# Patient Record
Sex: Male | Born: 1975 | Race: Black or African American | Hispanic: No | Marital: Single | Smoking: Never smoker
Health system: Southern US, Community
[De-identification: ages and names within clinical notes are randomized; demographics above are authoritative.]

## PROBLEM LIST (undated history)

## (undated) DIAGNOSIS — G4730 Sleep apnea, unspecified: Secondary | ICD-10-CM

## (undated) DIAGNOSIS — T7840XA Allergy, unspecified, initial encounter: Secondary | ICD-10-CM

## (undated) DIAGNOSIS — E669 Obesity, unspecified: Secondary | ICD-10-CM

## (undated) DIAGNOSIS — M1990 Unspecified osteoarthritis, unspecified site: Secondary | ICD-10-CM

## (undated) DIAGNOSIS — K219 Gastro-esophageal reflux disease without esophagitis: Secondary | ICD-10-CM

## (undated) HISTORY — PX: MANDIBLE SURGERY: SHX707

## (undated) HISTORY — DX: Obesity, unspecified: E66.9

## (undated) HISTORY — DX: Sleep apnea, unspecified: G47.30

## (undated) HISTORY — DX: Allergy, unspecified, initial encounter: T78.40XA

---

## 1999-03-19 ENCOUNTER — Emergency Department (HOSPITAL_COMMUNITY): Admission: EM | Admit: 1999-03-19 | Discharge: 1999-03-20 | Payer: Self-pay | Admitting: Emergency Medicine

## 1999-07-12 ENCOUNTER — Encounter: Payer: Self-pay | Admitting: Emergency Medicine

## 1999-07-12 ENCOUNTER — Emergency Department (HOSPITAL_COMMUNITY): Admission: EM | Admit: 1999-07-12 | Discharge: 1999-07-12 | Payer: Self-pay | Admitting: Emergency Medicine

## 1999-08-21 ENCOUNTER — Encounter: Payer: Self-pay | Admitting: Emergency Medicine

## 1999-08-21 ENCOUNTER — Emergency Department (HOSPITAL_COMMUNITY): Admission: EM | Admit: 1999-08-21 | Discharge: 1999-08-21 | Payer: Self-pay | Admitting: Emergency Medicine

## 2002-09-08 ENCOUNTER — Encounter: Payer: Self-pay | Admitting: Family Medicine

## 2002-09-08 ENCOUNTER — Encounter: Admission: RE | Admit: 2002-09-08 | Discharge: 2002-09-08 | Payer: Self-pay | Admitting: Family Medicine

## 2002-10-19 ENCOUNTER — Encounter: Admission: RE | Admit: 2002-10-19 | Discharge: 2003-01-17 | Payer: Self-pay | Admitting: Family Medicine

## 2004-01-19 ENCOUNTER — Ambulatory Visit (HOSPITAL_BASED_OUTPATIENT_CLINIC_OR_DEPARTMENT_OTHER): Admission: RE | Admit: 2004-01-19 | Discharge: 2004-01-19 | Payer: Self-pay | Admitting: Pulmonary Disease

## 2005-04-19 ENCOUNTER — Ambulatory Visit: Payer: Self-pay | Admitting: Family Medicine

## 2005-04-26 ENCOUNTER — Ambulatory Visit: Payer: Self-pay | Admitting: Family Medicine

## 2005-06-01 ENCOUNTER — Encounter: Admission: RE | Admit: 2005-06-01 | Discharge: 2005-06-01 | Payer: Self-pay | Admitting: Sports Medicine

## 2005-06-10 ENCOUNTER — Encounter: Admission: RE | Admit: 2005-06-10 | Discharge: 2005-09-08 | Payer: Self-pay | Admitting: Family Medicine

## 2005-06-25 ENCOUNTER — Ambulatory Visit (HOSPITAL_COMMUNITY): Admission: RE | Admit: 2005-06-25 | Discharge: 2005-06-26 | Payer: Self-pay | Admitting: Orthopedic Surgery

## 2006-12-29 ENCOUNTER — Ambulatory Visit: Payer: Self-pay | Admitting: Family Medicine

## 2007-01-08 ENCOUNTER — Ambulatory Visit: Payer: Self-pay | Admitting: Family Medicine

## 2007-01-26 ENCOUNTER — Ambulatory Visit: Payer: Self-pay | Admitting: Family Medicine

## 2007-01-26 LAB — CONVERTED CEMR LAB
ALT: 28 units/L (ref 0–40)
Albumin: 3.4 g/dL — ABNORMAL LOW (ref 3.5–5.2)
Alkaline Phosphatase: 71 units/L (ref 39–117)
BUN: 9 mg/dL (ref 6–23)
Basophils Absolute: 0.1 10*3/uL (ref 0.0–0.1)
Basophils Relative: 0.2 % (ref 0.0–1.0)
CO2: 31 meq/L (ref 19–32)
Calcium: 8.9 mg/dL (ref 8.4–10.5)
GFR calc Af Amer: 113 mL/min
HDL: 49.8 mg/dL (ref >39.0)
LDL Cholesterol: 96 mg/dL (ref 0–99)
Lymphocytes Relative: 25.4 % (ref 12.0–46.0)
Monocytes Relative: 7.6 % (ref 3.0–11.0)
Neutro Abs: 5 10*3/uL (ref 1.4–7.7)
Platelets: 255 10*3/uL (ref 150–400)
RDW: 12.3 % (ref 11.5–14.6)
T3, Free: 4.1 pg/mL (ref 2.3–4.2)
TSH: 2.78 microintl units/mL (ref 0.35–5.50)
Total Protein: 6.5 g/dL (ref 6.0–8.3)
Triglycerides: 79 mg/dL (ref 0–149)
VLDL: 16 mg/dL (ref 0–40)

## 2007-08-25 DIAGNOSIS — J309 Allergic rhinitis, unspecified: Secondary | ICD-10-CM | POA: Insufficient documentation

## 2007-12-25 DIAGNOSIS — R071 Chest pain on breathing: Secondary | ICD-10-CM

## 2007-12-28 ENCOUNTER — Ambulatory Visit: Payer: Self-pay | Admitting: Family Medicine

## 2007-12-30 ENCOUNTER — Encounter: Payer: Self-pay | Admitting: Family Medicine

## 2007-12-31 ENCOUNTER — Ambulatory Visit: Payer: Self-pay | Admitting: Family Medicine

## 2008-08-18 ENCOUNTER — Telehealth: Payer: Self-pay | Admitting: *Deleted

## 2008-08-23 ENCOUNTER — Encounter: Payer: Self-pay | Admitting: Family Medicine

## 2008-11-19 DIAGNOSIS — R1011 Right upper quadrant pain: Secondary | ICD-10-CM

## 2008-11-22 ENCOUNTER — Ambulatory Visit: Payer: Self-pay | Admitting: Family Medicine

## 2008-11-24 ENCOUNTER — Ambulatory Visit (HOSPITAL_COMMUNITY): Admission: RE | Admit: 2008-11-24 | Discharge: 2008-11-24 | Payer: Self-pay | Admitting: Family Medicine

## 2008-11-25 ENCOUNTER — Ambulatory Visit: Payer: Self-pay | Admitting: Family Medicine

## 2008-11-25 LAB — CONVERTED CEMR LAB
ALT: 26 units/L (ref 0–53)
AST: 21 units/L (ref 0–37)
Albumin: 3.5 g/dL (ref 3.5–5.2)
Alkaline Phosphatase: 88 units/L (ref 39–117)
Amylase: 45 units/L (ref 27–131)
Basophils Relative: 0.8 % (ref 0.0–3.0)
Eosinophils Relative: 1.5 % (ref 0.0–5.0)
Hemoglobin: 15.7 g/dL (ref 13.0–17.0)
Lymphocytes Relative: 29.6 % (ref 12.0–46.0)
MCHC: 35.2 g/dL (ref 30.0–36.0)
Monocytes Relative: 6.7 % (ref 3.0–12.0)
Neutro Abs: 5.7 10*3/uL (ref 1.4–7.7)
Neutrophils Relative %: 61.4 % (ref 43.0–77.0)
RBC: 4.91 M/uL (ref 4.22–5.81)
Total Protein: 7.2 g/dL (ref 6.0–8.3)
WBC: 9.2 10*3/uL (ref 4.5–10.5)

## 2008-11-29 ENCOUNTER — Encounter: Payer: Self-pay | Admitting: Family Medicine

## 2008-11-29 ENCOUNTER — Telehealth: Payer: Self-pay | Admitting: *Deleted

## 2008-12-01 ENCOUNTER — Ambulatory Visit (HOSPITAL_COMMUNITY): Admission: RE | Admit: 2008-12-01 | Discharge: 2008-12-01 | Payer: Self-pay | Admitting: Family Medicine

## 2008-12-06 ENCOUNTER — Encounter (INDEPENDENT_AMBULATORY_CARE_PROVIDER_SITE_OTHER): Payer: Self-pay | Admitting: *Deleted

## 2008-12-06 ENCOUNTER — Ambulatory Visit: Payer: Self-pay | Admitting: Gastroenterology

## 2008-12-13 ENCOUNTER — Telehealth: Payer: Self-pay | Admitting: *Deleted

## 2008-12-13 ENCOUNTER — Ambulatory Visit: Payer: Self-pay | Admitting: Gastroenterology

## 2008-12-13 DIAGNOSIS — R079 Chest pain, unspecified: Secondary | ICD-10-CM | POA: Insufficient documentation

## 2008-12-16 ENCOUNTER — Telehealth: Payer: Self-pay | Admitting: *Deleted

## 2008-12-20 ENCOUNTER — Ambulatory Visit: Payer: Self-pay | Admitting: Family Medicine

## 2009-01-17 ENCOUNTER — Ambulatory Visit: Payer: Self-pay | Admitting: Gastroenterology

## 2009-05-01 ENCOUNTER — Telehealth: Payer: Self-pay | Admitting: Gastroenterology

## 2009-05-02 ENCOUNTER — Encounter: Payer: Self-pay | Admitting: Gastroenterology

## 2009-05-02 ENCOUNTER — Ambulatory Visit: Payer: Self-pay | Admitting: Gastroenterology

## 2009-05-02 DIAGNOSIS — R1013 Epigastric pain: Secondary | ICD-10-CM | POA: Insufficient documentation

## 2009-05-02 DIAGNOSIS — K5900 Constipation, unspecified: Secondary | ICD-10-CM | POA: Insufficient documentation

## 2009-05-02 LAB — CONVERTED CEMR LAB
AST: 18 units/L (ref 0–37)
Albumin: 3.6 g/dL (ref 3.5–5.2)
BUN: 8 mg/dL (ref 6–23)
Basophils Relative: 0.4 % (ref 0.0–3.0)
Calcium: 9 mg/dL (ref 8.4–10.5)
Eosinophils Absolute: 0.1 10*3/uL (ref 0.0–0.7)
Ferritin: 66.2 ng/mL (ref 22.0–322.0)
Folate: >20 ng/mL
GFR calc non Af Amer: 125.21 mL/min (ref >60)
Glucose, Bld: 98 mg/dL (ref 70–99)
HCT: 44.7 % (ref 39.0–52.0)
Iron: 83 ug/dL (ref 42–165)
Lipase: 16 units/L (ref 11.0–59.0)
Lymphs Abs: 1.8 10*3/uL (ref 0.7–4.0)
MCHC: 34.6 g/dL (ref 30.0–36.0)
MCV: 90.5 fL (ref 78.0–100.0)
Monocytes Absolute: 0.4 10*3/uL (ref 0.1–1.0)
Neutrophils Relative %: 74.9 % (ref 43.0–77.0)
Platelets: 219 10*3/uL (ref 150.0–400.0)
Potassium: 4.7 meq/L (ref 3.5–5.1)
Sodium: 140 meq/L (ref 135–145)
TSH: 2.08 microintl units/mL (ref 0.35–5.50)
Total Bilirubin: 0.8 mg/dL (ref 0.3–1.2)

## 2009-05-04 ENCOUNTER — Ambulatory Visit: Payer: Self-pay | Admitting: Gastroenterology

## 2009-05-04 ENCOUNTER — Ambulatory Visit (HOSPITAL_COMMUNITY): Admission: RE | Admit: 2009-05-04 | Discharge: 2009-05-04 | Payer: Self-pay | Admitting: Gastroenterology

## 2009-08-18 HISTORY — PX: KNEE ARTHROSCOPY: SHX127

## 2009-09-01 ENCOUNTER — Ambulatory Visit (HOSPITAL_COMMUNITY): Admission: RE | Admit: 2009-09-01 | Discharge: 2009-09-01 | Payer: Self-pay | Admitting: Orthopaedic Surgery

## 2010-01-24 ENCOUNTER — Ambulatory Visit: Payer: Self-pay | Admitting: Family Medicine

## 2010-01-24 DIAGNOSIS — J209 Acute bronchitis, unspecified: Secondary | ICD-10-CM

## 2010-01-29 ENCOUNTER — Telehealth: Payer: Self-pay | Admitting: Family Medicine

## 2010-02-20 ENCOUNTER — Ambulatory Visit: Payer: Self-pay | Admitting: Family Medicine

## 2010-02-20 DIAGNOSIS — F329 Major depressive disorder, single episode, unspecified: Secondary | ICD-10-CM

## 2010-02-20 LAB — CONVERTED CEMR LAB
AST: 16 units/L (ref 0–37)
Albumin: 3.5 g/dL (ref 3.5–5.2)
BUN: 12 mg/dL (ref 6–23)
Basophils Absolute: 0 10*3/uL (ref 0.0–0.1)
CO2: 28 meq/L (ref 19–32)
Chloride: 104 meq/L (ref 96–112)
Eosinophils Absolute: 0.3 10*3/uL (ref 0.0–0.7)
Glucose, Bld: 86 mg/dL (ref 70–99)
HCT: 41.4 % (ref 39.0–52.0)
Hemoglobin: 14.2 g/dL (ref 13.0–17.0)
Lymphs Abs: 2.4 10*3/uL (ref 0.7–4.0)
MCHC: 34.3 g/dL (ref 30.0–36.0)
MCV: 91.2 fL (ref 78.0–100.0)
Monocytes Absolute: 0.6 10*3/uL (ref 0.1–1.0)
Monocytes Relative: 6.2 % (ref 3.0–12.0)
Neutro Abs: 6.6 10*3/uL (ref 1.4–7.7)
Platelets: 251 10*3/uL (ref 150.0–400.0)
Potassium: 4 meq/L (ref 3.5–5.1)
RDW: 13.4 % (ref 11.5–14.6)
Sodium: 139 meq/L (ref 135–145)
TSH: 3.27 microintl units/mL (ref 0.35–5.50)
Total Bilirubin: 0.2 mg/dL — ABNORMAL LOW (ref 0.3–1.2)

## 2010-06-26 ENCOUNTER — Ambulatory Visit: Payer: Self-pay | Admitting: Family Medicine

## 2010-06-26 LAB — CONVERTED CEMR LAB
Calcium: 8.7 mg/dL (ref 8.4–10.5)
Chloride: 106 meq/L (ref 96–112)
Creatinine, Ser: 0.9 mg/dL (ref 0.4–1.5)
Sodium: 141 meq/L (ref 135–145)

## 2010-06-27 ENCOUNTER — Encounter: Payer: Self-pay | Admitting: Family Medicine

## 2010-07-30 ENCOUNTER — Ambulatory Visit: Payer: Self-pay | Admitting: Family Medicine

## 2010-09-17 ENCOUNTER — Emergency Department (HOSPITAL_COMMUNITY): Admission: EM | Admit: 2010-09-17 | Discharge: 2010-09-17 | Payer: Self-pay | Admitting: Emergency Medicine

## 2010-09-20 ENCOUNTER — Ambulatory Visit: Payer: Self-pay | Admitting: Family Medicine

## 2010-09-20 DIAGNOSIS — M5137 Other intervertebral disc degeneration, lumbosacral region: Secondary | ICD-10-CM | POA: Insufficient documentation

## 2010-09-24 ENCOUNTER — Telehealth: Payer: Self-pay | Admitting: Family Medicine

## 2010-10-02 ENCOUNTER — Encounter
Admission: RE | Admit: 2010-10-02 | Discharge: 2010-10-31 | Payer: Self-pay | Source: Home / Self Care | Attending: Family Medicine | Admitting: Family Medicine

## 2010-10-05 ENCOUNTER — Encounter: Payer: Self-pay | Admitting: Family Medicine

## 2010-12-18 NOTE — Letter (Signed)
Summary: Out of Work  Adult nurse at [employer]  [address]   [place], [place] [address]   Phone: [phone]  Fax: [phone]    June 27, 2010   Employee:  Handy Donnella Bi    To Whom It May Concern:   For Medical reasons, please excuse the above named employee from work for the following dates:  Start:   06/26/10  End:   06/28/10  If you need additional information, please feel free to contact our office.         Sincerely,    Governor Specking, MD

## 2010-12-18 NOTE — Assessment & Plan Note (Signed)
Summary: follow up on bp/cjr   Vital Signs:  Patient profile:   35 year old male Weight:      487 pounds Temp:     98.2 degrees F oral BP sitting:   140 / 82  (right arm) Cuff size:   large  Vitals Entered By: Kathrynn Speed CMA (July 30, 2010 12:07 PM) CC: Fup on BP, src Is Patient Diabetic? No   Primary Care Provider:  Kelle Darting, MD  CC:  Fup on BP and src.  History of Present Illness: Christopher Krause is a 35 year old male, who comes in today for follow-up of hypertension.  He is on lisinopril 20 -- 25 daily BP by me today 130/80.  Arm sitting position  Preventive Screening-Counseling & Management  Alcohol-Tobacco     Smoking Status: never  Current Medications (verified): 1)  Lisinopril-Hydrochlorothiazide 20-25 Mg Tabs (Lisinopril-Hydrochlorothiazide) .... Take One Tab By Mouth Once Daily  Allergies (verified): No Known Drug Allergies  Review of Systems      See HPI  Physical Exam  General:  Well-developed,well-nourished,in no acute distress; alert,appropriate and cooperative throughout examination Heart:  130/80   Impression & Recommendations:  Problem # 1:  HYPERTENSION, ESSENTIAL NOS (ICD-401.9) Assessment Improved  His updated medication list for this problem includes:    Lisinopril-hydrochlorothiazide 20-25 Mg Tabs (Lisinopril-hydrochlorothiazide) .Marland Kitchen... Take one tab by mouth once daily  Complete Medication List: 1)  Lisinopril-hydrochlorothiazide 20-25 Mg Tabs (Lisinopril-hydrochlorothiazide) .... Take one tab by mouth once daily  Patient Instructions: 1)  continue current medication. 2)  Set up a time in the next 4 to 6 weeks for a complete physical exam 3)  BMP prior to visit, ICD-9:........v70.0.Marland KitchenMarland KitchenMarland Kitchen401.01 4)  Hepatic Panel prior to visit, ICD-9: 5)  Lipid Panel prior to visit, ICD-9: 6)  TSH prior to visit, ICD-9: 7)  CBC w/ Diff prior to visit, ICD-9:

## 2010-12-18 NOTE — Progress Notes (Signed)
Summary: note for work  Phone Note Call from Patient   Caller: Patient Call For: Abran Cantor Summary of Call: still congested, wants to stay out of work today and go back tomorow. Asking for note for today- you gave him one for last week.  ph- [phone]  Called again. Rudy Jew, RN  January 29, 2010 2:03 PM  Initial call taken by: Raechel Ache, RN,  January 29, 2010 11:40 AM  Follow-up for Phone Call        okay Follow-up by: Nelwyn Salisbury MD,  January 29, 2010 3:22 PM  Additional Follow-up for Phone Call Additional follow up Details #1::        called for pick-up. Additional Follow-up by: Raechel Ache, RN,  January 29, 2010 3:25 PM

## 2010-12-18 NOTE — Letter (Signed)
Summary: Out of Work  Adult nurse at [facility] [employer]  [address]   [place], [place] [address]   Phone: [phone]  Fax: [phone]    June 27, 2010   Employee:  Aydeen Donnella Bi    To Whom It May Concern:   For Medical reasons, please excuse the above named employee from work for the following dates:  Start:    End:    If you need additional information, please feel free to contact our office.         Sincerely,    Kathrynn Speed CMA

## 2010-12-18 NOTE — Assessment & Plan Note (Signed)
Summary: BLOOD PRESSURE CHECK/MIGRAINES/CJR   Vital Signs:  Patient profile:   35 year old male Temp:     98.5 degrees F oral BP sitting:   140 / 98  (left arm) Cuff size:   large  Vitals Entered By: Kern Reap CMA Duncan Dull) (June 26, 2010 1:48 PM) CC: blood pressure, migraine headaches   Primary Care Provider:  Kelle Darting, MD  CC:  blood pressure and migraine headaches.  History of Present Illness: Christopher Krause is a 35 year old single male, who works at [employer], who comes in today for evaluation of hypertension.  About 6 weeks ago.  He saw the plant nurse, who diagnosed him to have hypertension and start him on lisinopril 20 -- 25 daily.  He did not monitor his blood pressure, nor did he go back for follow-up.  He stopped his medication 10 days ago because he ran out and 3 days ago began having headaches yesterday.  He went to urgent care.  His blood pressure was 174/90.  They advised him to restart his medication, which he has not done yet.  His weight is unmeasurable.  These over 450 pounds.  Positive family history of hypertension  Allergies (verified): No Known Drug Allergies  Past History:  Past medical, surgical, family and social histories (including risk factors) reviewed for relevance to current acute and chronic problems.  Past Medical History: Reviewed history from 12/06/2008 and no changes required. Mood D/O Obese Allergic rhinitis Anxiety Disorder Sleep Apnea  Past Surgical History: Reviewed history from 02/20/2010 and no changes required. R Knee Surge.......Marland Kitchen  L knee scope 10/10  Family History: Reviewed history from 05/02/2009 and no changes required. Family History of Diabetes: Cousin, Grandmother Family History of Colon Cancer:Aunt---dx 54yo Family History Hypertension  Social History: Reviewed history from 12/06/2008 and no changes required. Occupation: Single Never Smoked Alcohol use-yes-on weekends-socially  Illicit Drug Use -  no Patient does not get regular exercise.   Review of Systems      See HPI  Physical Exam  General:  Well-developed,well-nourished,in no acute distress; alert,appropriate and cooperative throughout examination Heart:  160/90 right arm sitting position   Problems:  Medical Problems Added: 1)  Dx of Hypertension, Essential Nos  (ICD-401.9)  Impression & Recommendations:  Problem # 1:  HYPERTENSION, ESSENTIAL NOS (ICD-401.9) Assessment New  His updated medication list for this problem includes:    Lisinopril-hydrochlorothiazide 20-25 Mg Tabs (Lisinopril-hydrochlorothiazide) .Marland Kitchen... Take one tab by mouth once daily  Orders: Prescription Created Electronically [phone]) Venipuncture ([address]) TLB-BMP (Basic Metabolic Panel-BMET) (80048-METABOL) Specimen Handling ([address]11)  Complete Medication List: 1)  Lisinopril-hydrochlorothiazide 20-25 Mg Tabs (Lisinopril-hydrochlorothiazide) .... Take one tab by mouth once daily  Patient Instructions: 1)  take one lisinopril tablet when you get home now.  Then starting tomorrow morning, one tablet every morning.  Check her blood pressure daily in the morning with a digital blood pressure cuff.  Return in 3 weeks for follow-up. 2)  When you return in 3 weeks.  I will review your lab work with you unless there is something unusual.  If there is we will call you immediately Prescriptions: LISINOPRIL-HYDROCHLOROTHIAZIDE 20-25 MG TABS (LISINOPRIL-HYDROCHLOROTHIAZIDE) take one tab by mouth once daily  #100 x 3   Entered and Authorized by:   Roderick Pee MD   Signed by:   Roderick Pee MD on 06/26/2010   Method used:   [facility]  [address] [phone][facility] (retail[facility]  [address]       [place], [place]  [id number]       Ph: [phone] or [phone]       Fax: [phone]   RxID:   [id number]

## 2010-12-18 NOTE — Miscellaneous (Signed)
Summary: Initial Summary for PT Services/[facility]  Initial Summary for PT Services/[facility]   Imported By: Maryln Gottron 10/09/2010 11:20:28  _____________________________________________________________________  External Attachment:    Type:   Image     Comment:   External Document

## 2010-12-18 NOTE — Letter (Signed)
Summary: Out of Work  Adult nurse at [employer]  [address]   [place], [place] [address]   Phone: [phone]  Fax: [phone]    June 26, 2010   Employee:  Christopher Krause    To Whom It May Concern:   For Medical reasons, please excuse the above named employee from work for the following dates:  Start:   June 26, 2010  End:   June 27, 2010  If you need additional information, please feel free to contact our office.         Sincerely,    Kelle Darting, MD

## 2010-12-18 NOTE — Progress Notes (Signed)
Summary: note for work  Phone Note Call from Patient   Caller: Patient Call For: Roderick Pee MD Reason for Call: Talk to Doctor Summary of Call: patient is calling because he is still having some spams and cramping with his legs.  They have improved but not completely gone.  He would like to know if he can have a note for work for today and tomorrow.  Is this okay to give? Initial call taken by: Kern Reap CMA Duncan Dull),  September 24, 2010 12:57 PM  Follow-up for Phone Call        n......... he needs to go to work Follow-up by: Roderick Pee MD,  September 24, 2010 1:04 PM  Additional Follow-up for Phone Call Additional follow up Details #1::        Phone Call Completed Additional Follow-up by: Kern Reap CMA Duncan Dull),  September 24, 2010 3:45 PM

## 2010-12-18 NOTE — Letter (Signed)
Summary: Out of Work  [facility]  [address]   [place], [place] [address]   Phone: [phone]  Fax: [phone]    June 27, 2010   Employee:  Christopher Krause    To Whom It May Concern:   For Medical reasons, please excuse the above named employee from work for the following dates:  Start:    End:    If you need additional information, please feel free to contact our office.         Sincerely,    Sonya Carraway CMA 

## 2010-12-18 NOTE — Assessment & Plan Note (Signed)
Summary: fu from er/numbness, tingling in leg,toes/njr   Vital Signs:  Patient profile:   35 year old male Temp:     98.3 degrees F oral BP sitting:   130 / 90  (left arm) Cuff size:   large  Vitals Entered By: Kern Reap CMA Duncan Dull) (September 20, 2010 3:11 PM) CC: follow-up visit from er   Primary Care Provider:  Kelle Darting, MD  CC:  follow-up visit from er.  History of Present Illness: Karey is a 35 year old male, morbidly obese comes in today for evaluation of right hip pain, sharp, reading, down to his right foot, x 2 weeks, plus  He went to the emergency room for this on October 31 and was told it was muscle spasm.  His pain is constant.  It is sharp on a scale of one to 10 it is a 6 pain in face down to the right hip to the right foot.  He also has some sensation of numbness and tingling in that foot.  No previous history of back problems, although this is an accident waiting to happen with his morbid obesity  Allergies: No Known Drug Allergies  Past History:  Past medical, surgical, family and social histories (including risk factors) reviewed, and no changes noted (except as noted below).  Past Medical History: Reviewed history from 12/06/2008 and no changes required. Mood D/O Obese Allergic rhinitis Anxiety Disorder Sleep Apnea  Past Surgical History: Reviewed history from 02/20/2010 and no changes required. R Knee Surge.......Marland Kitchen  L knee scope 10/10  Family History: Reviewed history from 06/26/2010 and no changes required. Family History of Diabetes: Cousin, Grandmother Family History of Colon Cancer:Aunt---dx 54yo Family History Hypertension  Social History: Reviewed history from 12/06/2008 and no changes required. Occupation: Single Never Smoked Alcohol use-yes-on weekends-socially  Illicit Drug Use - no Patient does not get regular exercise.   Review of Systems      See HPI       Flu Vaccine Consent Questions     Do you have a history  of severe allergic reactions to this vaccine? no    Any prior history of allergic reactions to egg and/or gelatin? no    Do you have a sensitivity to the preservative Thimersol? no    Do you have a past history of Guillan-Barre Syndrome? no    Do you currently have an acute febrile illness? no    Have you ever had a severe reaction to latex? no    Vaccine information given and explained to patient? yes    Are you currently pregnant? no    Lot Number:[id number]   Exp Date:05/18/2011   Site Given Right  Deltoid IM   Physical Exam  General:  Well-developed,well-nourished,in no acute distress; alert,appropriate and cooperative throughout examination Msk:  No deformity or scoliosis noted of thoracic or lumbar spine.   Pulses:  R and L carotid,radial,femoral,dorsalis pedis and posterior tibial pulses are full and equal bilaterally Extremities:  No clubbing, cyanosis, edema, or deformity noted with normal full range of motion of all joints.   Neurologic:  No cranial nerve deficits noted. Station and gait are normal. Plantar reflexes are down-going bilaterally. DTRs are symmetrical throughout. Sensory, motor and coordinative functions appear intact.   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 Take one tab by mouth once daily 2)  Robaxin 500  Mg Tabs (Methocarbamol) .... Take 2 tabs four times a day 3)  Prednisone 20 Mg Tabs (Prednisone) .... Take  2 tabs for 4 days 4)  Oxycodone-acetaminophen 5-325 Mg Tabs (Oxycodone-acetaminophen) .... Take one tab every 4-6 hour as needed for pain 5)  Cyclobenzaprine Hcl 10 Mg Tabs (Cyclobenzaprine hcl) .... Take one tab two times a day as needed 6)  Etodolac 200 Mg Caps (Etodolac) .... As needed 7)  Flexeril 10 Mg Tabs (Cyclobenzaprine hcl) .Marland Kitchen.. 1 tab  @ bedtime 8)  Vicodin Es 7.5-750 Mg Tabs (Hydrocodone-acetaminophen) .Marland Kitchen.. 1 tab @ bedtime  Other Orders: Admin 1st Vaccine ([address]) Flu Vaccine 56yrs + ([address])  Patient Instructions: 1)  take 600 mg of Motrin twice daily with food, stop the prednisone, you can take a half of a Flexeril and Vicodin at bedtime.  It needed for severe pain at night. 2)  We will call and get set up for physical therapy. 3)  Return in two weeks for follow-up Prescriptions: VICODIN ES 7.5-750 MG TABS (HYDROCODONE-ACETAMINOPHEN) 1 tab @ bedtime  #30 x 9   Entered and Authorized by:   Roderick Pee MD   Signed by:   Roderick Pee MD on 09/20/2010   Method used:   Print then Give to Patient   RxID:   [id number] FLEXERIL 10 MG TABS (CYCLOBENZAPRINE HCL) 1 tab @ bedtime  #30 x 0   Entered and Authorized by:   Roderick Pee MD   Signed by:   Roderick Pee MD on 09/20/2010   Method used:   Print then Give to Patient   RxID:   [id number]    Orders Added: 1)  Physical Therapy Referral [PT] 2)  Est. Patient Level III [41324] 3)  Admin 1st Vaccine [90471] 4)  Flu Vaccine 49yrs + [401[address]]

## 2010-12-18 NOTE — Assessment & Plan Note (Signed)
Summary: chest congestion/sob/njr   Vital Signs:  Patient profile:   35 year old male Temp:     98.4 degrees F oral Pulse rate:   80 / minute Pulse rhythm:   regular BP sitting:   140 / 98  (left arm) Cuff size:   regular  Vitals Entered By: Kern Reap CMA Duncan Dull) (January 24, 2010 3:24 PM) CC: sinus pressure and drainage   History of Present Illness: Here for 4 days of HA, sinus pressure, ST, chest congestion, and coughing up green sputum. No fever. On Mucinex.   Allergies (verified): No Known Drug Allergies  Past History:  Past Medical History: Reviewed history from 12/06/2008 and no changes required. Mood D/O Obese Allergic rhinitis Anxiety Disorder Sleep Apnea  Review of Systems  The patient denies anorexia, fever, weight loss, weight gain, vision loss, decreased hearing, hoarseness, chest pain, syncope, dyspnea on exertion, peripheral edema, hemoptysis, abdominal pain, melena, hematochezia, severe indigestion/heartburn, hematuria, incontinence, genital sores, muscle weakness, suspicious skin lesions, transient blindness, difficulty walking, depression, unusual weight change, abnormal bleeding, enlarged lymph nodes, angioedema, breast masses, and testicular masses.    Physical Exam  General:  Well-developed,well-nourished,in no acute distress; alert,appropriate and cooperative throughout examination Head:  Normocephalic and atraumatic without obvious abnormalities. No apparent alopecia or balding. Eyes:  No corneal or conjunctival inflammation noted. EOMI. Perrla. Funduscopic exam benign, without hemorrhages, exudates or papilledema. Vision grossly normal. Ears:  External ear exam shows no significant lesions or deformities.  Otoscopic examination reveals clear canals, tympanic membranes are intact bilaterally without bulging, retraction, inflammation or discharge. Hearing is grossly normal bilaterally. Nose:  External nasal examination shows no deformity or  inflammation. Nasal mucosa are pink and moist without lesions or exudates. Mouth:  Oral mucosa and oropharynx without lesions or exudates.  Teeth in good repair. Neck:  No deformities, masses, or tenderness noted. Lungs:  scattered rhonchi   Impression & Recommendations:  Problem # 1:  ACUTE BRONCHITIS (ICD-466.0)  His updated medication list for this problem includes:    Zithromax Z-pak 250 Mg Tabs (Azithromycin) .Marland Kitchen... As directed  Complete Medication List: 1)  Nasonex 50 Mcg/act Susp (Mometasone furoate) .... Spray 2 spray as directed once a day 2)  Zyrtec Allergy 10 Mg Tabs (Cetirizine hcl) .... Take 1 tablet by mouth once a day 3)  Nexium 40 Mg Cpdr (Esomeprazole magnesium) .Marland Kitchen.. 1 capsule each day 30 minutes before breakfast and 30 minutes before bedtime 4)  Zithromax Z-pak 250 Mg Tabs (Azithromycin) .... As directed  Patient Instructions: 1)  Please schedule a follow-up appointment as needed .  Off work from 01-22-10 through 01-25-10. Prescriptions: ZITHROMAX Z-PAK 250 MG TABS (AZITHROMYCIN) as directed  #1 x 0   Entered and Authorized by:   Nelwyn Salisbury MD   Signed by:   Nelwyn Salisbury MD on 01/24/2010   Method used:   [facility]        [facility]  [address] [phone]* (retail)       [address]       [place], [place]  [id number]       Ph: [phone] or [phone]       Fax: [phone]   RxID:   [id number]

## 2010-12-18 NOTE — Assessment & Plan Note (Signed)
Summary: fatigue/high blood pressure/cjr   Vital Signs:  Patient profile:   35 year old male Height:      70 inches Weight:      500 pounds BMI:     72.00 Temp:     97.8 degrees F oral BP sitting:   142 / 92  (right arm) Cuff size:   large  Vitals Entered By: Kern Reap CMA Duncan Dull) (February 20, 2010 12:06 PM) CC: elevated blood pressure, tired, headaches Is Patient Diabetic? No CBG Result 85   Primary Care Provider:  Kelle Darting, MD  CC:  elevated blood pressure, tired, and headaches.  History of Present Illness: Christopher Krause is a 35 year old male, nonsmoker, who comes in today with a years.  History of decreased mood, decreased energy, more weight gain.  He.  He is now up to over 500 pounds.  He had a arthroscopic procedure on his left knee in October by Dr. Magnus Ivan for a torn cartilage.  He was enrolled with the general surgery program in anticipation of having bariatric surgery.  However, he did not follow-up.  he denies any suicidal ideation.  He continues to work at [employer]  Allergies: No Known Drug Allergies  Past History:  Past medical, surgical, family and social histories (including risk factors) reviewed, and no changes noted (except as noted below).  Past Medical History: Reviewed history from 12/06/2008 and no changes required. Mood D/O Obese Allergic rhinitis Anxiety Disorder Sleep Apnea  Past Surgical History: R Knee Surge.......Marland Kitchen  L knee scope 10/10  Family History: Reviewed history from 05/02/2009 and no changes required. Family History of Diabetes: Cousin, Grandmother Family History of Colon Cancer:Aunt---dx 54yo  Social History: Reviewed history from 12/06/2008 and no changes required. Occupation: Single Never Smoked Alcohol use-yes-on weekends-socially  Illicit Drug Use - no Patient does not get regular exercise.   Review of Systems      See HPI  Physical Exam  General:  obese male, tearful Psych:  depressed  affect and labile affect.     Problems:  Medical Problems Added: 1)  Dx of Depressive Disorder  (ICD-311)  Impression & Recommendations:  Problem # 1:  MORBID OBESITY (ICD-278.01) Assessment Deteriorated  Orders: Venipuncture ([address]) TLB-BMP (Basic Metabolic Panel-BMET) (80048-METABOL) TLB-CBC Platelet - w/Differential (85025-CBCD) TLB-Hepatic/Liver Function Pnl (80076-HEPATIC) TLB-TSH (Thyroid Stimulating Hormone) (84443-TSH)  Problem # 2:  DEPRESSIVE DISORDER (ICD-311) Assessment: New  His updated medication list for this problem includes:    Celexa 20 Mg Tabs (Citalopram hydrobromide) .Marland Kitchen... 1 tab @ bedtime  Orders: Venipuncture (98119) TLB-BMP (Basic Metabolic Panel-BMET) (80048-METABOL) TLB-CBC Platelet - w/Differential (85025-CBCD) TLB-Hepatic/Liver Function Pnl (80076-HEPATIC) TLB-TSH (Thyroid Stimulating Hormone) (84443-TSH)  Complete Medication List: 1)  Nasonex 50 Mcg/act Susp (Mometasone furoate) .... Spray 2 spray as directed once a day 2)  Zyrtec Allergy 10 Mg Tabs (Cetirizine hcl) .... Take 1 tablet by mouth once a day 3)  Nexium 40 Mg Cpdr (Esomeprazole magnesium) .Marland Kitchen.. 1 capsule each day 30 minutes before breakfast and 30 minutes before bedtime 4)  Celexa 20 Mg Tabs (Citalopram hydrobromide) .Marland Kitchen.. 1 tab @ bedtime 5)  Ritalin 10 Mg Tabs (Methylphenidate hcl) .... Take 1 tablet by mouth two times a day  Other Orders: Capillary Blood Glucose/CBG (14782)  Patient Instructions: 1)  begin Celexa 20 mg daily at bedtime. 2)  Also began Ritalin 10 mg b.i.d. 3)  re-  contact the general surgery office to begin the process.  A new for the bariatric surgery 4)  Please schedule  a follow-up appointment in 1 month. Prescriptions: RITALIN 10 MG TABS (METHYLPHENIDATE HCL) Take 1 tablet by mouth two times a day  #60 x 0   Entered and Authorized by:   Roderick Pee MD   Signed by:   Roderick Pee MD on 02/20/2010   Method used:   Print then Give to Patient   RxID:    [id number] CELEXA 20 MG TABS (CITALOPRAM HYDROBROMIDE) 1 tab @ bedtime  #100 x 3   Entered and Authorized by:   Roderick Pee MD   Signed by:   Roderick Pee MD on 02/20/2010   Method used:   [facility]        [facility] [facility] [address] [phone]* (retail)       [address]       [place], [place]  [id number]       Ph: [phone] or [phone]       Fax: [phone]   RxID:   [id number]

## 2010-12-18 NOTE — Letter (Signed)
Summary: Out of Work  Adult nurse at [facility]  [address]   [place], [place] [address]   Phone: [phone]  Fax: [phone]    February 20, 2010   Employee:  Lavone Orn    To Whom It May Concern:   For Medical reasons, please excuse the above named employee from work for the following dates:  Start:   February 19, 2010  End:    If you need additional information, please feel free to contact our office.         Sincerely,    Kelle Darting, MD

## 2011-02-21 LAB — CBC
Hemoglobin: 14.7 g/dL (ref 13.0–17.0)
MCV: 91.3 fL (ref 78.0–100.0)
RBC: 4.84 MIL/uL (ref 4.22–5.81)
WBC: 10.3 10*3/uL (ref 4.0–10.5)

## 2011-03-11 ENCOUNTER — Ambulatory Visit (INDEPENDENT_AMBULATORY_CARE_PROVIDER_SITE_OTHER): Payer: BC Managed Care – PPO | Admitting: Family Medicine

## 2011-03-11 ENCOUNTER — Encounter: Payer: Self-pay | Admitting: Family Medicine

## 2011-03-11 DIAGNOSIS — J309 Allergic rhinitis, unspecified: Secondary | ICD-10-CM

## 2011-03-11 LAB — BASIC METABOLIC PANEL
CO2: 28 mEq/L (ref 19–32)
Chloride: 99 mEq/L (ref 96–112)
Glucose, Bld: 85 mg/dL (ref 70–99)
Sodium: 140 mEq/L (ref 135–145)

## 2011-03-11 LAB — CBC WITH DIFFERENTIAL/PLATELET
Basophils Relative: 0.5 % (ref 0.0–3.0)
Eosinophils Absolute: 0.1 10*3/uL (ref 0.0–0.7)
Hemoglobin: 15 g/dL (ref 13.0–17.0)
Lymphs Abs: 1.9 10*3/uL (ref 0.7–4.0)
MCHC: 34.3 g/dL (ref 30.0–36.0)
MCV: 92.2 fl (ref 78.0–100.0)
Monocytes Absolute: 0.5 10*3/uL (ref 0.1–1.0)
Neutro Abs: 6.1 10*3/uL (ref 1.4–7.7)
RBC: 4.73 Mil/uL (ref 4.22–5.81)

## 2011-03-11 LAB — POCT URINALYSIS DIPSTICK
Bilirubin, UA: NEGATIVE
Blood, UA: NEGATIVE
Ketones, UA: NEGATIVE
Leukocytes, UA: NEGATIVE
pH, UA: 7

## 2011-03-11 LAB — HEPATIC FUNCTION PANEL
Albumin: 3.5 g/dL (ref 3.5–5.2)
Total Protein: 6.5 g/dL (ref 6.0–8.3)

## 2011-03-11 LAB — HEMOGLOBIN A1C: Hgb A1c MFr Bld: 5.5 % (ref 4.6–6.5)

## 2011-03-11 LAB — LIPID PANEL
Cholesterol: 166 mg/dL (ref 0–200)
HDL: 60.9 mg/dL (ref >39.00)
Triglycerides: 73 mg/dL (ref 0.0–149.0)

## 2011-03-11 MED ORDER — PREDNISONE 20 MG PO TABS: ORAL_TABLET | ORAL | Status: DC

## 2011-03-11 NOTE — Progress Notes (Signed)
  Subjective:    Patient ID: Christopher Krause, male    DOB: 1976-08-05, 35 y.o.   MRN: [id number]  HPIdedrick Is a 35 year old male, who comes in today for evaluation of two problems.  We are not able to weigh him on our office scales.  He weighs over 400 pounds.  He has a history of seasonal allergic rhinitis, for which you take an OTC Claritin, however, in the last for 5 days is having a lot of facial pain over his sinuses.  No wheezing.  At one point, we had set him up to go get a consult for bariatric surgery, and, over that never happened    Review of Systems    General an immunologic review of systems otherwise negative Objective:   Physical Exam Very obese male, in no acute distress.  HEENT negative except for 3+ nasal edema.  Neck was supple.  Lungs were clear       Assessment & Plan:  Allergic rhinitis,,,,,,,,, prednisone burst and taper.  Morbid obesity,,,,,,,,,,, surgical consult

## 2011-03-11 NOTE — Patient Instructions (Signed)
Take the prednisone as directed.  When you're finished the prednisone take plain Zyrtec 10 mg a day at bedtime for her allergy symptoms.  We will get she set up a consult in general surgery for evaluation of the surgical treatment for morbid obesity

## 2011-03-18 ENCOUNTER — Ambulatory Visit (INDEPENDENT_AMBULATORY_CARE_PROVIDER_SITE_OTHER): Payer: BC Managed Care – PPO | Admitting: Family Medicine

## 2011-03-18 ENCOUNTER — Encounter: Payer: Self-pay | Admitting: Family Medicine

## 2011-03-18 VITALS — BP 120/90 | Temp 98.1°F | Ht 70.5 in

## 2011-03-18 DIAGNOSIS — I10 Essential (primary) hypertension: Secondary | ICD-10-CM

## 2011-03-18 MED ORDER — LISINOPRIL-HYDROCHLOROTHIAZIDE 20-25 MG PO TABS: 1.0000 | ORAL_TABLET | Freq: Every day | ORAL | Status: DC

## 2011-03-18 NOTE — Patient Instructions (Signed)
Continue to take your blood pressure medication daily.  Follow-up in one year with me sooner if any problems

## 2011-03-18 NOTE — Progress Notes (Signed)
  Subjective:    Patient ID: Christopher Krause, male    DOB: [birth year], 35 y.o.   MRN: [id number]  HPI  Christopher Krause Is a 35 year old single male, who comes in today for general physical examination because of a history of morbid obesity and hypertension.  We are unable to weigh him here in the office therefore, his weight is over 350 pounds.  He [place] himself episodically [place] [employer].  And weighs about 508 pounds.  His blood pressure is well controlled with the Zestoretic 20 to 25 daily.  BP 120/90.  He is in the process of pursuing the gastric bypass surgery, which I would recommend very highly for him.  Because other than the hypertension well controlled with the Zestoretic.  He does not have diabetes, hyperlipidemia, et Karie Soda.    Review of Systems  Constitutional: Negative.   HENT: Negative.   Eyes: Negative.   Respiratory: Negative.   Cardiovascular: Negative.   Gastrointestinal: Negative.   Genitourinary: Negative.   Musculoskeletal: Negative.   Skin: Negative.   Neurological: Negative.   Hematological: Negative.   Psychiatric/Behavioral: Negative.        Objective:   Physical Exam  Constitutional: He is oriented to person, place, and time. He appears well-developed and well-nourished.       obese  HENT:  Head: Normocephalic and atraumatic.  Right Ear: External ear normal.  Left Ear: External ear normal.  Nose: Nose normal.  Mouth/Throat: Oropharynx is clear and moist.  Eyes: Conjunctivae and EOM are normal. Pupils are equal, round, and reactive to light.  Neck: Normal range of motion. Neck supple. No JVD present. No tracheal deviation present. No thyromegaly present.  Cardiovascular: Normal rate, regular rhythm, normal heart sounds and intact distal pulses.  Exam reveals no gallop and no friction rub.   No murmur heard. Pulmonary/Chest: Effort normal and breath sounds normal. No stridor. No respiratory distress. He has no wheezes. He has no rales. He exhibits  no tenderness.  Abdominal: Soft. Bowel sounds are normal. He exhibits no distension and no mass. There is no tenderness. There is no rebound and no guarding.       Massive panniculus  Genitourinary: Rectum normal, prostate normal and penis normal. Guaiac negative stool. No penile tenderness.  Musculoskeletal: Normal range of motion. He exhibits no edema and no tenderness.  Lymphadenopathy:    He has no cervical adenopathy.  Neurological: He is alert and oriented to person, place, and time. He has normal reflexes. No cranial nerve deficit. He exhibits normal muscle tone.  Skin: Skin is warm and dry. No rash noted. No erythema. No pallor.  Psychiatric: He has a normal mood and affect. His behavior is normal. Judgment and thought content normal.          Assessment & Plan:  Morbid obesity,,,,,,,,,, continue to pursue the gastric surgery option.  Hypertension,,,,,,,,, continue current medication.  Follow-up in one year or sooner if any problem

## 2011-03-27 ENCOUNTER — Encounter: Payer: Self-pay | Admitting: Family Medicine

## 2011-03-27 ENCOUNTER — Ambulatory Visit (INDEPENDENT_AMBULATORY_CARE_PROVIDER_SITE_OTHER): Payer: BC Managed Care – PPO | Admitting: Family Medicine

## 2011-03-27 VITALS — BP 130/82 | Temp 98.9°F

## 2011-03-27 DIAGNOSIS — J329 Chronic sinusitis, unspecified: Secondary | ICD-10-CM

## 2011-03-27 MED ORDER — AMOXICILLIN 875 MG PO TABS: 875.0000 mg | ORAL_TABLET | Freq: Two times a day (BID) | ORAL | Status: AC

## 2011-03-27 NOTE — Progress Notes (Signed)
  Subjective:    Patient ID: Christopher Krause, male    DOB: 1976/08/14, 35 y.o.   MRN: [id number]  HPI Patient seen with an almost 4 week history of left frontal sinus pressure. Head and nasal congestion. Placed on prednisone recently for allergy symptoms and overall congestion has improved somewhat. He has some persistent yellowish nasal discharge left naris. Occasional productive cough. No fever or chills. Denies sore throat. Occasional laryngitis symptoms. Some increased malaise.   Review of Systems  Constitutional: Negative for fever, chills and fatigue.  HENT: Positive for postnasal drip and sinus pressure. Negative for sore throat and trouble swallowing.   Respiratory: Positive for cough. Negative for shortness of breath.   Neurological: Negative for headaches.       Objective:   Physical Exam  Constitutional: He appears well-developed.       Patient morbidly obese. Pleasant and cooperative  HENT:  Right Ear: External ear normal.  Left Ear: External ear normal.  Mouth/Throat: Oropharynx is clear and moist. No oropharyngeal exudate.       Tender over her left frontal sinus but no visible swelling  Neck: Neck supple.  Cardiovascular: Normal rate, regular rhythm and normal heart sounds.   Pulmonary/Chest: Effort normal and breath sounds normal. No respiratory distress. He has no wheezes. He has no rales.  Lymphadenopathy:    He has no cervical adenopathy.          Assessment & Plan:  Probable acute left frontal sinusitis. Amoxicillin 875 mg twice daily for 10 days.

## 2011-03-27 NOTE — Patient Instructions (Signed)

## 2011-04-05 NOTE — Op Note (Signed)
NAMEDAEJON, LICH NO.:  [id number]   MEDICAL RECORD NO.:  [id number]          PATIENT TYPE:  OIB   LOCATION:  2305                         FACILITY:  [facility]   PHYSICIAN:  Elana Alm. Thurston Hole, M.D. DATE OF BIRTH:  01/13/1976   DATE OF PROCEDURE:  06/25/2005  DATE OF DISCHARGE:                                 OPERATIVE REPORT   PREOPERATIVE DIAGNOSIS:  Right knee medial and lateral meniscal tears with  chondromalacia.   POSTOPERATIVE DIAGNOSIS:  Right knee medial and lateral meniscal tears with  chondromalacia.   PROCEDURE:  1.  Right knee EUA followed by arthroscopic partial medial and lateral      meniscectomy.  2.  Right knee patellofemoral chondroplasty.   SURGEON:  Elana Alm. Thurston Hole, M.D.   ASSISTANT:  Julien Girt, P.A.   ANESTHESIA:  General.   OPERATIVE TIME:  30 minutes.   COMPLICATIONS:  None.   DESCRIPTION OF PROCEDURE:  Mr. Guymon brought to operating room on June 25, 2005, placed operative table supine position. After adequate level  general anesthesia was obtained, his right knee was examined. Range of  motion from 0 to 125 degrees, 1 to 2+ crepitation, knee stable ligamentous  exam with normal patellar tracking. The knee was sterilely injected with  0.5% Marcaine with epinephrine. Right leg was then prepped using sterile  DuraPrep and draped using sterile technique. Originally through an  anterolateral portal, the arthroscope with a pump attached was placed and  through an anteromedial portal, an arthroscopic probe was placed. On initial  inspection of the medial compartment, the articular cartilage showed 25%  grade 3 chondromalacia which was debrided. Medial meniscus tear posterior  and medial horn of which 50% was resected back to stable rim.  Intercondylar  notch inspected, anterior and posterior cruciate ligaments were normal.  Lateral compartment inspected.  Mild grade 1 and two chondromalacia. Lateral  meniscus tear 20%  posterolateral horn which was resected back to stable rim.  Patellofemoral joint showed 25% grade 3 chondromalacia on the medial  patellar facet which was debrided. The rest of the articular cartilage was  intact. Patella tracked normally. Medial and lateral gutters were free of  pathology. After this was done, it was felt that all pathology been  satisfactorily addressed. The instruments were removed. Portals closed with  3-0 nylon suture and injected with 0.25% Marcaine with epinephrine and 4  milligrams morphine. Sterile dressings applied. The patient awakened and  taken to recovery in stable condition.   FOLLOW-UP CARE:  Mr. Reppucci will be followed overnight for observation due  to sleep apnea. He will be discharged in 24 hours if stable.  He will be  seen back in the office in a week for sutures out follow-up.       RAW/MEDQ  D:  06/26/2005  T:  06/26/2005  Job:  [id number]

## 2011-05-06 ENCOUNTER — Ambulatory Visit (INDEPENDENT_AMBULATORY_CARE_PROVIDER_SITE_OTHER): Payer: BC Managed Care – PPO | Admitting: Internal Medicine

## 2011-05-06 ENCOUNTER — Encounter: Payer: Self-pay | Admitting: Internal Medicine

## 2011-05-06 VITALS — BP 130/92 | HR 108 | Wt >= 6400 oz

## 2011-05-06 DIAGNOSIS — J329 Chronic sinusitis, unspecified: Secondary | ICD-10-CM | POA: Insufficient documentation

## 2011-05-06 DIAGNOSIS — J309 Allergic rhinitis, unspecified: Secondary | ICD-10-CM

## 2011-05-06 MED ORDER — LEVOFLOXACIN 500 MG PO TABS: 500.0000 mg | ORAL_TABLET | Freq: Every day | ORAL | Status: AC

## 2011-05-06 NOTE — Progress Notes (Signed)
  Subjective:    Patient ID: Christopher Krause, male    DOB: 1976/08/25, 35 y.o.   MRN: [id number]  HPI Pt presents to clinic for evaluation of sinusitis. Notes 3+month h/o chronic rhinosinusitis sxs with nasal drainage/congestion as well as left frontal sinus pain/pressure. Sx's worse at nigh. Denies fever, chills or cough. Took course of prednisone 02/2011 with resolution of sx's for 1-2 weeks followed by resumption. Seen 04/06/11 and took 10d course of amoxicillin with resolution of sxs for one week followed by resumption. Has chronic h/o rhinitis for which he uses flonase nightly. No other exacerbating or alleviating sx's. No other complaints.  Reviewed pmh, medications and allergies    Review of Systems  Constitutional: Negative for fever, chills and fatigue.  HENT: Positive for congestion, rhinorrhea and sinus pressure. Negative for sore throat, facial swelling and ear discharge.   Eyes: Negative for discharge and redness.  Respiratory: Negative for cough and shortness of breath.   Skin: Negative for color change and rash.       Objective:   Physical Exam  Nursing note and vitals reviewed. Constitutional: He appears well-developed and well-nourished. No distress.  HENT:  Head: Normocephalic and atraumatic.  Right Ear: Tympanic membrane, external ear and ear canal normal.  Left Ear: Tympanic membrane, external ear and ear canal normal.  Nose: Mucosal edema present. Right sinus exhibits no maxillary sinus tenderness and no frontal sinus tenderness. Left sinus exhibits maxillary sinus tenderness and frontal sinus tenderness.  Mouth/Throat: Oropharynx is clear and moist. No oropharyngeal exudate.  Eyes: Conjunctivae are normal. Right eye exhibits no discharge. Left eye exhibits no discharge. No scleral icterus.  Neck: Neck supple.  Lymphadenopathy:    He has no cervical adenopathy.  Neurological: He is alert.  Skin: Skin is warm and dry. No rash noted. He is not diaphoretic. No  erythema.  Psychiatric: He has a normal mood and affect.          Assessment & Plan:

## 2011-05-06 NOTE — Assessment & Plan Note (Signed)
Begin 14 day course of levaquin 500mg  qd. Work note 6/18 and 6/19. ENT consult.

## 2011-05-06 NOTE — Assessment & Plan Note (Signed)
Increase flonase to 2 sprays each nostril qhs

## 2011-05-10 ENCOUNTER — Ambulatory Visit
Admission: RE | Admit: 2011-05-10 | Discharge: 2011-05-10 | Disposition: A | Discharge status: Home / Self Care | Payer: BC Managed Care – PPO | Source: Ambulatory Visit | Attending: Otolaryngology | Admitting: Otolaryngology

## 2011-05-10 ENCOUNTER — Other Ambulatory Visit: Payer: Self-pay | Admitting: Otolaryngology

## 2011-05-10 DIAGNOSIS — R51 Headache: Secondary | ICD-10-CM

## 2011-05-10 DIAGNOSIS — R0981 Nasal congestion: Secondary | ICD-10-CM

## 2011-12-12 ENCOUNTER — Ambulatory Visit (INDEPENDENT_AMBULATORY_CARE_PROVIDER_SITE_OTHER): Payer: BC Managed Care – PPO

## 2011-12-12 DIAGNOSIS — J029 Acute pharyngitis, unspecified: Secondary | ICD-10-CM

## 2011-12-12 DIAGNOSIS — R509 Fever, unspecified: Secondary | ICD-10-CM

## 2012-08-03 ENCOUNTER — Ambulatory Visit: Payer: BC Managed Care – PPO

## 2012-08-03 ENCOUNTER — Telehealth: Payer: Self-pay | Admitting: Family Medicine

## 2012-08-03 ENCOUNTER — Ambulatory Visit (INDEPENDENT_AMBULATORY_CARE_PROVIDER_SITE_OTHER): Payer: BC Managed Care – PPO | Admitting: Family Medicine

## 2012-08-03 VITALS — BP 158/92 | HR 85 | Temp 98.7°F | Resp 22 | Ht 69.75 in

## 2012-08-03 DIAGNOSIS — R079 Chest pain, unspecified: Secondary | ICD-10-CM

## 2012-08-03 DIAGNOSIS — I10 Essential (primary) hypertension: Secondary | ICD-10-CM

## 2012-08-03 DIAGNOSIS — R002 Palpitations: Secondary | ICD-10-CM

## 2012-08-03 DIAGNOSIS — K219 Gastro-esophageal reflux disease without esophagitis: Secondary | ICD-10-CM

## 2012-08-03 LAB — POCT CBC
HCT, POC: 46.3 % (ref 43.5–53.7)
Lymph, poc: 2.3 (ref 0.6–3.4)
MCHC: 31.7 g/dL — AB (ref 31.8–35.4)
POC Granulocyte: 6.2 (ref 2–6.9)
POC LYMPH PERCENT: 25.5 %L (ref 10–50)
RDW, POC: 13.2 %

## 2012-08-03 LAB — BASIC METABOLIC PANEL
BUN: 9 mg/dL (ref 6–23)
Calcium: 9.1 mg/dL (ref 8.4–10.5)
Creat: 0.85 mg/dL (ref 0.50–1.35)
Glucose, Bld: 95 mg/dL (ref 70–99)
Sodium: 139 mEq/L (ref 135–145)

## 2012-08-03 LAB — TSH: TSH: 2.2 u[IU]/mL (ref 0.350–4.500)

## 2012-08-03 MED ORDER — RANITIDINE HCL 150 MG PO TABS: 150.0000 mg | ORAL_TABLET | Freq: Two times a day (BID) | ORAL | Status: DC

## 2012-08-03 NOTE — Telephone Encounter (Signed)
Caller: Christopher Krause/Patient; Patient Name: Christopher Krause; PCP: Kelle Darting [facility]); Best Callback Phone Number: [phone]; Reason for call: Chest Pain/Chest Discomfort.  Pt reports chest pains that began last night.  Pt states the pain is in the middle of chest. Pt denies any SOB, sweating, nausea or vomiting.    Pt reports burping a lot and pain increases. Triaged patient per Chest Pain Protocol.  See Provider within 72 hours Disposition for 'Frequent episodes of indigestion/reflux'.  Home care advice given and appt scheduled for 08/04/12 at 12:30 with Dr Tawanna Cooler

## 2012-08-03 NOTE — Progress Notes (Signed)
Subjective:    Patient ID: Christopher Krause, male    DOB: 17-Oct-1976, 36 y.o.   MRN: [id number]  HPI Christopher Krause is a 36 y.o. male Hx of HTN, obesity.  Patient of Dr Tawanna Cooler - see phone message today - appt scheduled tomorrow.   Acid reflux, indigestion type pain starting around 10pm,  Last ate at 3 or 4 pm.  Had hawaiian punch.  Did have pizza and beer night prior. No known hx of heartburn.  No known personal or FH of early CAD.  No sweating/N/V/SOB.  Fells like needs to burp. NO radiation of pain. Belching more today. Feels tight with hiccup or yawn.  No recent prolonged car travel or air travel, no recent calf pain or swelling. Able to clear fluids, no solid foods yet today.   Tx: none.    Palpitations - few flutters while at work last week - 1 week ago. No chest pain then, felt like heart sped up temporarily - up to 5 minutes only - resolved after sitting down.  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  No prior similar symptoms.   Etoh: weekends at times - up to 4 beers. No IDU.  Nonsmoker.   Review of Systems  Constitutional: Negative for fever, chills and unexpected weight change.  Respiratory: Negative for cough, choking and shortness of breath.   Cardiovascular: Positive for chest pain and palpitations.       Objective:   Physical Exam  Constitutional: He is oriented to person, place, and time. He appears well-developed and well-nourished.       Obese.  HENT:  Head: Normocephalic and atraumatic.  Cardiovascular: Normal rate, regular rhythm, normal heart sounds and intact distal pulses.   No extrasystoles are present.       Distant heart sounds with body habitus, but no apparent ectopy or murmur.   Pulmonary/Chest: Effort normal and breath sounds normal. No respiratory distress. He has no wheezes. He has no rales. He exhibits no tenderness.  Abdominal: Soft.  Musculoskeletal: He exhibits edema (trace pedal edema.).       Calves nontender.     Neurological: He is alert and oriented to person, place, and time.  Skin: Skin is warm and dry.  Psychiatric: He has a normal mood and affect. His behavior is normal.   GI cocktail given - some improvement in symptoms. Still belching.    [facility] reading (PRIMARY) by  Dr. Neva Seat: CXR: NAD - difficult exam.   EKG: SR, no acute findings.   Results for orders placed in visit on 08/03/12  POCT CBC      Component Value Range   WBC 9.2  4.6 - 10.2 K/uL   Lymph, poc 2.3  0.6 - 3.4   POC LYMPH PERCENT 25.5  10 - 50 %L   MID (cbc) 0.6  0 - 0.9   POC MID % 6.7  0 - 12 %M   POC Granulocyte 6.2  2 - 6.9   Granulocyte percent 67.8  37 - 80 %G   RBC 4.81  4.69 - 6.13 M/uL   Hemoglobin 14.7  14.1 - 18.1 g/dL   HCT, POC 08.6  57.8 - 53.7 %   MCV 96.3  80 - 97 fL   MCH, POC 30.6  27 - 31.2 pg   MCHC 31.7 (*) 31.8 - 35.4 g/dL   RDW, POC 46.9     Platelet Count, POC 262  142 - 424 K/uL   MPV  9.7  0 - 99.8 fL        Assessment & Plan:  Christopher Krause is a 36 y.o. male 1. Chest pain  EKG 12-Lead, DG Chest 2 View, POCT CBC, Basic metabolic panel, TSH   Belching and improvement with GI cocktail - suspect GERD.  Doubt cardiac cause, but discussed ER/911 chest pin precautions and understanding expressed. Start zantac 150mg  BID, avoid GERD triggers - handout given. Follow up in place tomorrow with Dr. Tawanna Cooler.   Palpitiations - by history - asx currently.  CBC and EKG WNL, TSh and BMP pending.  Patient Instructions  Follow up with Dr. Tawanna Cooler as scheduled. Start Zantac and avoid spicy foods as discussed.  Return to the clinic or go to the nearest emergency room if any of your symptoms worsen or new symptoms occur. Heartburn Heartburn is a painful, burning sensation in the chest. It may feel worse in certain positions, such as lying down or bending over. It is caused by stomach acid backing up into the tube that carries food from the mouth down to the stomach (lower esophagus).  CAUSES   Large meals.    Certain foods and drinks.   Exercise.   Increased acid production.   Being overweight or obese.   Certain medicines.  SYMPTOMS   Burning pain in the chest or lower throat.   Bitter taste in the mouth.   Coughing.  DIAGNOSIS  If the usual treatments for heartburn do not improve your symptoms, then tests may be done to see if there is another condition present. Possible tests may include:  X-rays.   Endoscopy. This is when a tube with a light and a camera on the end is used to examine the esophagus and the stomach.   A test to measure the amount of acid in the esophagus (pH test).   A test to see if the esophagus is working properly (esophageal manometry).   Blood, breath, or stool tests to check for bacteria that cause ulcers.  TREATMENT   Your caregiver may tell you to use certain over-the-counter medicines (antacids, acid reducers) for mild heartburn.   Your caregiver may prescribe medicines to decrease the acid in your stomach or protect your stomach lining.   Your caregiver may recommend certain diet changes.   For severe cases, your caregiver may recommend that the head of your bed be elevated on blocks. (Sleeping with more pillows is not an effective treatment as it only changes the position of your head and does not improve the main problem of stomach acid refluxing into the esophagus.)  HOME CARE INSTRUCTIONS   Take all medicines as directed by your caregiver.   Raise the head of your bed by putting blocks under the legs if instructed to by your caregiver.   Do not exercise right after eating.   Avoid eating 2 or 3 hours before bed. Do not lie down right after eating.   Eat small meals throughout the day instead of 3 large meals.   Stop smoking if you smoke.   Maintain a healthy weight.   Identify foods and beverages that make your symptoms worse and avoid them. Foods you may want to avoid include:   Peppers.   Chocolate.   High-fat foods,  including fried foods.   Spicy foods.   Garlic and onions.   Citrus fruits, including oranges, grapefruit, lemons, and limes.   Food containing tomatoes or tomato products.   Mint.   Carbonated drinks, caffeinated drinks, and  alcohol.   Vinegar.  SEEK IMMEDIATE MEDICAL CARE IF:  You have severe chest pain that goes down your arm or into your jaw or neck.   You feel sweaty, dizzy, or lightheaded.   You are short of breath.   You vomit blood.   You have difficulty or pain with swallowing.   You have bloody or black, tarry stools.   You have episodes of heartburn more than 3 times a week for more than 2 weeks.  MAKE SURE YOU:  Understand these instructions.   Will watch your condition.   Will get help right away if you are not doing well or get worse.  Document Released: 03/23/2009 Document Revised: 10/24/2011 Document Reviewed: 04/21/2011 [facility] Patient Information 2012 [place], [place].

## 2012-08-03 NOTE — Addendum Note (Signed)
Addended by: Meredith Staggers R on: 08/03/2012 04:03 PM   Modules accepted: Orders

## 2012-08-03 NOTE — Patient Instructions (Signed)
Follow up with Dr. Tawanna Cooler as scheduled. Start Zantac and avoid spicy foods as discussed.  Return to the clinic or go to the nearest emergency room if any of your symptoms worsen or new symptoms occur. Heartburn Heartburn is a painful, burning sensation in the chest. It may feel worse in certain positions, such as lying down or bending over. It is caused by stomach acid backing up into the tube that carries food from the mouth down to the stomach (lower esophagus).  CAUSES   Large meals.   Certain foods and drinks.   Exercise.   Increased acid production.   Being overweight or obese.   Certain medicines.  SYMPTOMS   Burning pain in the chest or lower throat.   Bitter taste in the mouth.   Coughing.  DIAGNOSIS  If the usual treatments for heartburn do not improve your symptoms, then tests may be done to see if there is another condition present. Possible tests may include:  X-rays.   Endoscopy. This is when a tube with a light and a camera on the end is used to examine the esophagus and the stomach.   A test to measure the amount of acid in the esophagus (pH test).   A test to see if the esophagus is working properly (esophageal manometry).   Blood, breath, or stool tests to check for bacteria that cause ulcers.  TREATMENT   Your caregiver may tell you to use certain over-the-counter medicines (antacids, acid reducers) for mild heartburn.   Your caregiver may prescribe medicines to decrease the acid in your stomach or protect your stomach lining.   Your caregiver may recommend certain diet changes.   For severe cases, your caregiver may recommend that the head of your bed be elevated on blocks. (Sleeping with more pillows is not an effective treatment as it only changes the position of your head and does not improve the main problem of stomach acid refluxing into the esophagus.)  HOME CARE INSTRUCTIONS   Take all medicines as directed by your caregiver.   Raise the head  of your bed by putting blocks under the legs if instructed to by your caregiver.   Do not exercise right after eating.   Avoid eating 2 or 3 hours before bed. Do not lie down right after eating.   Eat small meals throughout the day instead of 3 large meals.   Stop smoking if you smoke.   Maintain a healthy weight.   Identify foods and beverages that make your symptoms worse and avoid them. Foods you may want to avoid include:   Peppers.   Chocolate.   High-fat foods, including fried foods.   Spicy foods.   Garlic and onions.   Citrus fruits, including oranges, grapefruit, lemons, and limes.   Food containing tomatoes or tomato products.   Mint.   Carbonated drinks, caffeinated drinks, and alcohol.   Vinegar.  SEEK IMMEDIATE MEDICAL CARE IF:  You have severe chest pain that goes down your arm or into your jaw or neck.   You feel sweaty, dizzy, or lightheaded.   You are short of breath.   You vomit blood.   You have difficulty or pain with swallowing.   You have bloody or black, tarry stools.   You have episodes of heartburn more than 3 times a week for more than 2 weeks.  MAKE SURE YOU:  Understand these instructions.   Will watch your condition.   Will get help right away if you  are not doing well or get worse.  Document Released: 03/23/2009 Document Revised: 10/24/2011 Document Reviewed: 04/21/2011 [facility] Patient Information 2012 [place], [place].

## 2012-08-04 ENCOUNTER — Ambulatory Visit (INDEPENDENT_AMBULATORY_CARE_PROVIDER_SITE_OTHER): Payer: BC Managed Care – PPO | Admitting: Family Medicine

## 2012-08-04 ENCOUNTER — Encounter: Payer: Self-pay | Admitting: Family Medicine

## 2012-08-04 NOTE — Progress Notes (Signed)
  Subjective:    Patient ID: Christopher Krause, male    DOB: 1976/06/14, 36 y.o.   MRN: [id number]  HPI Francee Piccolo is a 36 year old male nonsmoker morbidly obese weight 544 pounds who comes in today for with a three-day history of reflux esophagitis  He states he awoke around 10 PM on this past Sunday night with a sensation of burning in his sternum and hiccups. He also a lot of burping. He restarted his Zantac 150 mg twice a day but he still symptomatic although he is having no difficulty swallowing.   Review of Systems    general and GI review of systems otherwise negative Objective:   Physical Exam Well-developed well-nourished male in no acute distress examination of the neck and throat was normal lungs are clear       Assessment & Plan:  Reflux esophagitis plan Prilosec 20 twice a day GI consult if symptoms persist or free develops dysphasia

## 2012-08-04 NOTE — Patient Instructions (Signed)
Prilosec 20 mg twice daily  Nothing D. the drink for 3 hours before bedtime  Sleep on 2 pillows  If your symptoms persist after 2-3 weeks then call and we will get you set up for a GI consult

## 2012-08-31 ENCOUNTER — Encounter: Payer: Self-pay | Admitting: Pulmonary Disease

## 2012-12-03 ENCOUNTER — Ambulatory Visit: Payer: BC Managed Care – PPO

## 2012-12-03 ENCOUNTER — Ambulatory Visit (INDEPENDENT_AMBULATORY_CARE_PROVIDER_SITE_OTHER): Payer: BC Managed Care – PPO | Admitting: Family Medicine

## 2012-12-03 ENCOUNTER — Other Ambulatory Visit: Payer: Self-pay | Admitting: Family Medicine

## 2012-12-03 VITALS — BP 152/71 | HR 76 | Temp 98.1°F | Resp 16 | Ht 71.0 in | Wt >= 6400 oz

## 2012-12-03 DIAGNOSIS — T148XXA Other injury of unspecified body region, initial encounter: Secondary | ICD-10-CM

## 2012-12-03 DIAGNOSIS — M545 Low back pain, unspecified: Secondary | ICD-10-CM

## 2012-12-03 MED ORDER — CYCLOBENZAPRINE HCL 5 MG PO TABS: 5.0000 mg | ORAL_TABLET | Freq: Three times a day (TID) | ORAL | Status: DC | PRN

## 2012-12-03 MED ORDER — ETODOLAC 200 MG PO CAPS: 200.0000 mg | ORAL_CAPSULE | Freq: Three times a day (TID) | ORAL | Status: DC

## 2012-12-03 NOTE — Patient Instructions (Addendum)

## 2012-12-03 NOTE — Progress Notes (Signed)
[facility]:  Office Visit  Chief Complaint:  Chief Complaint  Patient presents with  . Back Pain    x 1 days    HPI: Christopher Krause is a 37 y.o. male who complains of acute on chronic low back pain. 1 week ago had back stiffness but yesterday felt a pull in lower back after helping aunt pull her car out of the mud. Lifted and pushed car up and felt the pain, 7-8/10 pain with walking. Has not tried anything for it. He has a h/o bulging disc. Dr. Magnus Ivan is his orthopod.  Was treated with rehab. Works as a Location manager.   Past Medical History  Diagnosis Date  . Mood disorder   . Obese   . Allergy   . Anxiety   . Sleep apnea    Past Surgical History  Procedure Date  . Knee arthroscopy 08/18/09    right and left    History   Social History  . Marital Status: Single    Spouse Name: N/A    Number of Children: N/A  . Years of Education: N/A   Social History Main Topics  . Smoking status: Never Smoker   . Smokeless tobacco: None  . Alcohol Use: Yes  . Drug Use: No  . Sexually Active:    Other Topics Concern  . None   Social History Narrative  . None   Family History  Problem Relation Age of Onset  . Diabetes Other   . Cancer Other 54    colon, Aunt  . Hypertension Other   . Hypertension Mother    No Known Allergies Prior to Admission medications   Medication Sig Start Date End Date Taking? Authorizing Provider  cetirizine (ZYRTEC) 10 MG tablet Take 10 mg by mouth. otc     Historical Provider, MD  lisinopril-hydrochlorothiazide (PRINZIDE,ZESTORETIC) 20-25 MG per tablet Take 1 tablet by mouth daily. 03/18/11   Roderick Pee, MD  ranitidine (ZANTAC) 150 MG tablet Take 1 tablet (150 mg total) by mouth 2 (two) times daily. 08/03/12   Shade Flood, MD     ROS: The patient denies fevers, chills, night sweats, unintentional weight loss, chest pain, palpitations, wheezing, dyspnea on exertion, nausea, vomiting, abdominal pain, dysuria,  hematuria, melena, numbness, weakness, or tingling.   All other systems have been reviewed and were otherwise negative with the exception of those mentioned in the HPI and as above.    PHYSICAL EXAM: Filed Vitals:   12/03/12 1441  BP: 152/71  Pulse: 76  Temp: 98.1 F (36.7 C)  Resp: 16   Filed Vitals:   12/03/12 1441  Height: 5\' 11"  (1.803 m)  Weight: 520 lb (235.87 kg)   Body mass index is 72.53 kg/(m^2).  General: Alert, no acute distress. Pleasant morbidly obese AA male.  HEENT:  Normocephalic, atraumatic, oropharynx patent. EOMI, PERRLA Cardiovascular:  Regular rate and rhythm, no rubs murmurs or gallops.  No Carotid bruits, radial pulse intact. No pedal edema.  Respiratory: Clear to auscultation bilaterally.  No wheezes, rales, or rhonchi.  No cyanosis, no use of accessory musculature GI: No organomegaly, abdomen is soft and non-tender, positive bowel sounds.  No masses. Skin: No rashes. Neurologic: Facial musculature symmetric. Psychiatric: Patient is appropriate throughout our interaction. Lymphatic: No cervical lymphadenopathy Musculoskeletal: Gait intact. Low back-warm at midline Decrease ROM in flexion, extensions Neg straight leg 5/5 strength, senstation intact, no saddle anesthesia   LABS:    EKG/XRAY:   Primary read  interpreted by Dr. Conley Rolls at [facility].   ASSESSMENT/PLAN: Encounter Diagnoses  Name Primary?  . Lumbar pain Yes  . Sprain and strain    Pleasant AA gentleman with acute on chronic low back pain Attempted to do xrays but standing xray quality was  poor due to large body habitus. I told him this and he understands.  F/u in 2 weeks or sooner prn Rx Flexeril  And Etodolac ROM exercises at home, Consider PT if no improvement     LE, THAO PHUONG, DO 12/03/2012 3:33 PM

## 2013-02-10 ENCOUNTER — Ambulatory Visit (INDEPENDENT_AMBULATORY_CARE_PROVIDER_SITE_OTHER): Payer: BC Managed Care – PPO | Admitting: Family Medicine

## 2013-02-10 VITALS — BP 140/87 | HR 100 | Temp 98.4°F | Resp 18 | Ht 70.5 in

## 2013-02-10 DIAGNOSIS — R112 Nausea with vomiting, unspecified: Secondary | ICD-10-CM

## 2013-02-10 DIAGNOSIS — R197 Diarrhea, unspecified: Secondary | ICD-10-CM

## 2013-02-10 DIAGNOSIS — A084 Viral intestinal infection, unspecified: Secondary | ICD-10-CM

## 2013-02-10 DIAGNOSIS — A088 Other specified intestinal infections: Secondary | ICD-10-CM

## 2013-02-10 MED ORDER — ONDANSETRON 4 MG PO TBDP
4.0000 mg | ORAL_TABLET | Freq: Once | ORAL | Status: AC
  Administered 2013-02-10: 4 mg via ORAL

## 2013-02-10 MED ORDER — ONDANSETRON 4 MG PO TBDP: 4.0000 mg | ORAL_TABLET | Freq: Three times a day (TID) | ORAL | Status: DC | PRN

## 2013-02-10 NOTE — Patient Instructions (Addendum)
Frequent sips of fluids as discussed, zofran up to every 8 hours if needed for nausea.  Return to the clinic or go to the nearest emergency room if any of your symptoms worsen or new symptoms occur.    Gastroenteritis:  Diarrhea Infections caused by germs (bacterial) or a virus commonly cause diarrhea. Your caregiver has determined that with time, rest and fluids, the diarrhea should improve. In general, eat normally while drinking more water than usual. Although water may prevent dehydration, it does not contain salt and minerals (electrolytes). Broths, weak tea without caffeine and oral rehydration solutions (ORS) replace fluids and electrolytes. Small amounts of fluids should be taken frequently. Large amounts at one time may not be tolerated. Plain water may be harmful in infants and the elderly. Oral rehydrating solutions (ORS) are available at pharmacies and grocery stores. ORS replace water and important electrolytes in proper proportions. Sports drinks are not as effective as ORS and may be harmful due to sugars worsening diarrhea.  ORS is especially recommended for use in children with diarrhea. As a general guideline for children, replace any new fluid losses from diarrhea and/or vomiting with ORS as follows:   If your child weighs 22 pounds or under (10 kg or less), give 60-120 mL ( -  cup or 2 - 4 ounces) of ORS for each episode of diarrheal stool or vomiting episode.   If your child weighs more than 22 pounds (more than 10 kgs), give 120-240 mL ( - 1 cup or 4 - 8 ounces) of ORS for each diarrheal stool or episode of vomiting.   While correcting for dehydration, children should eat normally. However, foods high in sugar should be avoided because this may worsen diarrhea. Large amounts of carbonated soft drinks, juice, gelatin desserts and other highly sugared drinks should be avoided.   After correction of dehydration, other liquids that are appealing to the child may be added.  Children should drink small amounts of fluids frequently and fluids should be increased as tolerated. Children should drink enough fluids to keep urine clear or pale yellow.   Adults should eat normally while drinking more fluids than usual. Drink small amounts of fluids frequently and increase as tolerated. Drink enough fluids to keep urine clear or pale yellow. Broths, weak decaffeinated tea, lemon lime soft drinks (allowed to go flat) and ORS replace fluids and electrolytes.   Avoid:   Carbonated drinks.   Juice.   Extremely hot or cold fluids.   Caffeine drinks.   Fatty, greasy foods.   Alcohol.   Tobacco.   Too much intake of anything at one time.   Gelatin desserts.   Probiotics are active cultures of beneficial bacteria. They may lessen the amount and number of diarrheal stools in adults. Probiotics can be found in yogurt with active cultures and in supplements.   Wash hands well to avoid spreading bacteria and virus.   Anti-diarrheal medications are not recommended for infants and children.   Only take over-the-counter or prescription medicines for pain, discomfort or fever as directed by your caregiver. Do not give aspirin to children because it may cause Reye's Syndrome.   For adults, ask your caregiver if you should continue all prescribed and over-the-counter medicines.   If your caregiver has given you a follow-up appointment, it is very important to keep that appointment. Not keeping the appointment could result in a chronic or permanent injury, and disability. If there is any problem keeping the appointment, you must call back  to this facility for assistance.  SEEK IMMEDIATE MEDICAL CARE IF:   You or your child is unable to keep fluids down or other symptoms or problems become worse in spite of treatment.   Vomiting or diarrhea develops and becomes persistent.   There is vomiting of blood or bile (green material).   There is blood in the stool or the stools  are black and tarry.   There is no urine output in 6-8 hours or there is only a small amount of very dark urine.   Abdominal pain develops, increases or localizes.   You have a fever.   Your baby is older than 3 months with a rectal temperature of 102 F (38.9 C) or higher.   Your baby is 98 months old or younger with a rectal temperature of 100.4 F (38 C) or higher.   You or your child develops excessive weakness, dizziness, fainting or extreme thirst.   You or your child develops a rash, stiff neck, severe headache or become irritable or sleepy and difficult to awaken.  MAKE SURE YOU:   Understand these instructions.   Will watch your condition.   Will get help right away if you are not doing well or get worse.  Document Released: 10/25/2002 Document Revised: 10/24/2011 Document Reviewed: 09/11/2009 [facility] Patient Information 2012 [place], [place].  Nausea and Vomiting Nausea is a sick feeling that often comes before throwing up (vomiting). Vomiting is a reflex where stomach contents come out of your mouth. Vomiting can cause severe loss of body fluids (dehydration). Children and elderly adults can become dehydrated quickly, especially if they also have diarrhea. Nausea and vomiting are symptoms of a condition or disease. It is important to find the cause of your symptoms. CAUSES   Direct irritation of the stomach lining. This irritation can result from increased acid production (gastroesophageal reflux disease), infection, food poisoning, taking certain medicines (such as nonsteroidal anti-inflammatory drugs), alcohol use, or tobacco use.   Signals from the brain.These signals could be caused by a headache, heat exposure, an inner ear disturbance, increased pressure in the brain from injury, infection, a tumor, or a concussion, pain, emotional stimulus, or metabolic problems.   An obstruction in the gastrointestinal tract (bowel obstruction).   Illnesses such as diabetes,  hepatitis, gallbladder problems, appendicitis, kidney problems, cancer, sepsis, atypical symptoms of a heart attack, or eating disorders.   Medical treatments such as chemotherapy and radiation.   Receiving medicine that makes you sleep (general anesthetic) during surgery.  DIAGNOSIS Your caregiver may ask for tests to be done if the problems do not improve after a few days. Tests may also be done if symptoms are severe or if the reason for the nausea and vomiting is not clear. Tests may include:  Urine tests.   Blood tests.   Stool tests.   Cultures (to look for evidence of infection).   X-rays or other imaging studies.  Test results can help your caregiver make decisions about treatment or the need for additional tests. TREATMENT You need to stay well hydrated. Drink frequently but in small amounts.You may wish to drink water, sports drinks, clear broth, or eat frozen ice pops or gelatin dessert to help stay hydrated.When you eat, eating slowly may help prevent nausea.There are also some antinausea medicines that may help prevent nausea. HOME CARE INSTRUCTIONS   Take all medicine as directed by your caregiver.   If you do not have an appetite, do not force yourself to eat. However, you  must continue to drink fluids.   If you have an appetite, eat a normal diet unless your caregiver tells you differently.   Eat a variety of complex carbohydrates (rice, wheat, potatoes, bread), lean meats, yogurt, fruits, and vegetables.   Avoid high-fat foods because they are more difficult to digest.   Drink enough water and fluids to keep your urine clear or pale yellow.   If you are dehydrated, ask your caregiver for specific rehydration instructions. Signs of dehydration may include:   Severe thirst.   Dry lips and mouth.   Dizziness.   Dark urine.   Decreasing urine frequency and amount.   Confusion.   Rapid breathing or pulse.  SEEK IMMEDIATE MEDICAL CARE IF:   You have  blood or brown flecks (like coffee grounds) in your vomit.   You have black or bloody stools.   You have a severe headache or stiff neck.   You are confused.   You have severe abdominal pain.   You have chest pain or trouble breathing.   You do not urinate at least once every 8 hours.   You develop cold or clammy skin.   You continue to vomit for longer than 24 to 48 hours.   You have a fever.  MAKE SURE YOU:   Understand these instructions.   Will watch your condition.   Will get help right away if you are not doing well or get worse.  Document Released: 11/04/2005 Document Revised: 10/24/2011 Document Reviewed: 04/03/2011 [facility] Patient Information 2012 [place], [place].  Return to the clinic or go to the nearest emergency room if any of your symptoms worsen or new symptoms occur.

## 2013-02-10 NOTE — Progress Notes (Signed)
Subjective:    Patient ID: Christopher Krause, male    DOB: 1976/10/16, 37 y.o.   MRN: [id number]  HPI [name] is a 37 y.o. male Started at 2-3 am - watery diarrhea and vomiting. Emesis x 2 total.  Nausea, but last emesis at 6am., diarrhea - 3 episodes.   Drinking ginger ale and water.  Diarrhea followed.  UOP x 1 today - just prior to ov.  Able to sip fluids.   No known fever, but subjective chills.   Sick contacts - father and nieces with same sx's.  NO recent foreign travel, raw/undercooked foods, or antibiotics. No recent hospitalization.   Tx: none.   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 Nonsmoker, no etoh,  Review of Systems  Constitutional: Positive for chills. Negative for fever.  Respiratory: Negative for chest tightness.   Cardiovascular: Negative for chest pain.  Gastrointestinal: Positive for nausea, vomiting, abdominal pain (cramping) and diarrhea. Negative for blood in stool and abdominal distention.  Skin: Negative for rash.  Neurological: Positive for headaches. Negative for light-headedness.       Objective:   Physical Exam  Vitals reviewed. Constitutional: He is oriented to person, place, and time. He appears well-developed and well-nourished.  Overweight/obese.   HENT:  Head: Normocephalic and atraumatic.  Right Ear: Tympanic membrane and ear canal normal.  Left Ear: Tympanic membrane and ear canal normal.  Nose: No rhinorrhea.  Mouth/Throat: Oropharynx is clear and moist and mucous membranes are normal. No oropharyngeal exudate or posterior oropharyngeal erythema.  Eyes: Conjunctivae are normal. Pupils are equal, round, and reactive to light.  Neck: Neck supple.  Cardiovascular: Normal rate, regular rhythm, normal heart sounds and intact distal pulses.   No murmur heard. Distant but no murmur.   Pulmonary/Chest: Effort normal and breath sounds normal. He has no wheezes. He has no rhonchi. He has no rales.  Abdominal: Soft. Bowel sounds are  increased. There is generalized tenderness (minimal "cramping" sensation. no deep ttp, or guarding, and diffuse. ). There is no rigidity, no guarding and no CVA tenderness.  Musculoskeletal: He exhibits no edema.  Lymphadenopathy:    He has no cervical adenopathy.  Neurological: He is alert and oriented to person, place, and time.  Skin: Skin is warm and dry. No rash noted.  Psychiatric: He has a normal mood and affect. His behavior is normal.       Assessment & Plan:  [name] is a 37 y.o. male Nausea with vomiting - Plan: ondansetron (ZOFRAN-ODT) disintegrating tablet 4 mg, ondansetron (ZOFRAN ODT) 4 MG disintegrating tablet  Diarrhea  Christopher Krause - Plan: ondansetron (ZOFRAN ODT) 4 MG disintegrating tablet  Likely Christopher Krause with some persistent nausea, but resolved diarrhea at present and appears overall hydrated. Cramping abd pain with diarrhea/vomiting likely.   Sx care, rtc precautions, zofran if needed and ORT discussed. rtc precautions discussed.  Meds ordered this encounter  Medications  . ondansetron (ZOFRAN-ODT) disintegrating tablet 4 mg    Sig:   . ondansetron (ZOFRAN ODT) 4 MG disintegrating tablet    Sig: Take 1 tablet (4 mg total) by mouth every 8 (eight) hours as needed for nausea.    Dispense:  10 tablet    Refill:  0    Patient Instructions  Frequent sips of fluids as discussed, zofran up to every 8 hours if needed for nausea.  Return to the clinic or go to the nearest emergency room if any of your symptoms worsen or new symptoms occur.  Krause:  Diarrhea Infections caused by germs (bacterial) or a virus commonly cause diarrhea. Your caregiver has determined that with time, rest and fluids, the diarrhea should improve. In general, eat normally while drinking more water than usual. Although water may prevent dehydration, it does not contain salt and minerals (electrolytes). Broths, weak tea without caffeine and oral  rehydration solutions (ORS) replace fluids and electrolytes. Small amounts of fluids should be taken frequently. Large amounts at one time may not be tolerated. Plain water may be harmful in infants and the elderly. Oral rehydrating solutions (ORS) are available at pharmacies and grocery stores. ORS replace water and important electrolytes in proper proportions. Sports drinks are not as effective as ORS and may be harmful due to sugars worsening diarrhea.  ORS is especially recommended for use in children with diarrhea. As a general guideline for children, replace any new fluid losses from diarrhea and/or vomiting with ORS as follows:   If your child weighs 22 pounds or under (10 kg or less), give 60-120 mL ( -  cup or 2 - 4 ounces) of ORS for each episode of diarrheal stool or vomiting episode.   If your child weighs more than 22 pounds (more than 10 kgs), give 120-240 mL ( - 1 cup or 4 - 8 ounces) of ORS for each diarrheal stool or episode of vomiting.   While correcting for dehydration, children should eat normally. However, foods high in sugar should be avoided because this may worsen diarrhea. Large amounts of carbonated soft drinks, juice, gelatin desserts and other highly sugared drinks should be avoided.   After correction of dehydration, other liquids that are appealing to the child may be added. Children should drink small amounts of fluids frequently and fluids should be increased as tolerated. Children should drink enough fluids to keep urine clear or pale yellow.   Adults should eat normally while drinking more fluids than usual. Drink small amounts of fluids frequently and increase as tolerated. Drink enough fluids to keep urine clear or pale yellow. Broths, weak decaffeinated tea, lemon lime soft drinks (allowed to go flat) and ORS replace fluids and electrolytes.   Avoid:   Carbonated drinks.   Juice.   Extremely hot or cold fluids.   Caffeine drinks.   Fatty, greasy  foods.   Alcohol.   Tobacco.   Too much intake of anything at one time.   Gelatin desserts.   Probiotics are active cultures of beneficial bacteria. They may lessen the amount and number of diarrheal stools in adults. Probiotics can be found in yogurt with active cultures and in supplements.   Wash hands well to avoid spreading bacteria and virus.   Anti-diarrheal medications are not recommended for infants and children.   Only take over-the-counter or prescription medicines for pain, discomfort or fever as directed by your caregiver. Do not give aspirin to children because it may cause Reye's Syndrome.   For adults, ask your caregiver if you should continue all prescribed and over-the-counter medicines.   If your caregiver has given you a follow-up appointment, it is very important to keep that appointment. Not keeping the appointment could result in a chronic or permanent injury, and disability. If there is any problem keeping the appointment, you must call back to this facility for assistance.  SEEK IMMEDIATE MEDICAL CARE IF:   You or your child is unable to keep fluids down or other symptoms or problems become worse in spite of treatment.   Vomiting or diarrhea develops  and becomes persistent.   There is vomiting of blood or bile (green material).   There is blood in the stool or the stools are black and tarry.   There is no urine output in 6-8 hours or there is only a small amount of very dark urine.   Abdominal pain develops, increases or localizes.   You have a fever.   Your baby is older than 3 months with a rectal temperature of 102 F (38.9 C) or higher.   Your baby is 17 months old or younger with a rectal temperature of 100.4 F (38 C) or higher.   You or your child develops excessive weakness, dizziness, fainting or extreme thirst.   You or your child develops a rash, stiff neck, severe headache or become irritable or sleepy and difficult to awaken.  MAKE  SURE YOU:   Understand these instructions.   Will watch your condition.   Will get help right away if you are not doing well or get worse.  Document Released: 10/25/2002 Document Revised: 10/24/2011 Document Reviewed: 09/11/2009 [facility] Patient Information 2012 [place], [place].  Nausea and Vomiting Nausea is a sick feeling that often comes before throwing up (vomiting). Vomiting is a reflex where stomach contents come out of your mouth. Vomiting can cause severe loss of body fluids (dehydration). Children and elderly adults can become dehydrated quickly, especially if they also have diarrhea. Nausea and vomiting are symptoms of a condition or disease. It is important to find the cause of your symptoms. CAUSES   Direct irritation of the stomach lining. This irritation can result from increased acid production (gastroesophageal reflux disease), infection, food poisoning, taking certain medicines (such as nonsteroidal anti-inflammatory drugs), alcohol use, or tobacco use.   Signals from the brain.These signals could be caused by a headache, heat exposure, an inner ear disturbance, increased pressure in the brain from injury, infection, a tumor, or a concussion, pain, emotional stimulus, or metabolic problems.   An obstruction in the gastrointestinal tract (bowel obstruction).   Illnesses such as diabetes, hepatitis, gallbladder problems, appendicitis, kidney problems, cancer, sepsis, atypical symptoms of a heart attack, or eating disorders.   Medical treatments such as chemotherapy and radiation.   Receiving medicine that makes you sleep (general anesthetic) during surgery.  DIAGNOSIS Your caregiver may ask for tests to be done if the problems do not improve after a few days. Tests may also be done if symptoms are severe or if the reason for the nausea and vomiting is not clear. Tests may include:  Urine tests.   Blood tests.   Stool tests.   Cultures (to look for evidence of  infection).   X-rays or other imaging studies.  Test results can help your caregiver make decisions about treatment or the need for additional tests. TREATMENT You need to stay well hydrated. Drink frequently but in small amounts.You may wish to drink water, sports drinks, clear broth, or eat frozen ice pops or gelatin dessert to help stay hydrated.When you eat, eating slowly may help prevent nausea.There are also some antinausea medicines that may help prevent nausea. HOME CARE INSTRUCTIONS   Take all medicine as directed by your caregiver.   If you do not have an appetite, do not force yourself to eat. However, you must continue to drink fluids.   If you have an appetite, eat a normal diet unless your caregiver tells you differently.   Eat a variety of complex carbohydrates (rice, wheat, potatoes, bread), lean meats, yogurt, fruits, and vegetables.  Avoid high-fat foods because they are more difficult to digest.   Drink enough water and fluids to keep your urine clear or pale yellow.   If you are dehydrated, ask your caregiver for specific rehydration instructions. Signs of dehydration may include:   Severe thirst.   Dry lips and mouth.   Dizziness.   Dark urine.   Decreasing urine frequency and amount.   Confusion.   Rapid breathing or pulse.  SEEK IMMEDIATE MEDICAL CARE IF:   You have blood or brown flecks (like coffee grounds) in your vomit.   You have black or bloody stools.   You have a severe headache or stiff neck.   You are confused.   You have severe abdominal pain.   You have chest pain or trouble breathing.   You do not urinate at least once every 8 hours.   You develop cold or clammy skin.   You continue to vomit for longer than 24 to 48 hours.   You have a fever.  MAKE SURE YOU:   Understand these instructions.   Will watch your condition.   Will get help right away if you are not doing well or get worse.  Document Released: 11/04/2005  Document Revised: 10/24/2011 Document Reviewed: 04/03/2011 [facility] Patient Information 2012 [place], [place].  Return to the clinic or go to the nearest emergency room if any of your symptoms worsen or new symptoms occur.

## 2013-03-08 ENCOUNTER — Ambulatory Visit (INDEPENDENT_AMBULATORY_CARE_PROVIDER_SITE_OTHER): Payer: BC Managed Care – PPO | Admitting: Family Medicine

## 2013-03-08 ENCOUNTER — Telehealth: Payer: Self-pay | Admitting: Family Medicine

## 2013-03-08 ENCOUNTER — Encounter: Payer: Self-pay | Admitting: Family Medicine

## 2013-03-08 ENCOUNTER — Encounter: Payer: Self-pay | Admitting: *Deleted

## 2013-03-08 DIAGNOSIS — J309 Allergic rhinitis, unspecified: Secondary | ICD-10-CM

## 2013-03-08 MED ORDER — PREDNISONE 20 MG PO TABS: ORAL_TABLET | ORAL | Status: DC

## 2013-03-08 NOTE — Telephone Encounter (Signed)
Pt is coming in now

## 2013-03-08 NOTE — Patient Instructions (Addendum)
Temporarily stopped the Zyrtec while you're taking the prednisone  Take the prednisone is tapered as outlined  When you stop the prednisone restart the Zyrtec

## 2013-03-08 NOTE — Telephone Encounter (Signed)
Pt has headaches, nasal congestion, weak, has gotten progressively worse since last week. Over the counter meds not working. Pt would like to come in today, due to the fact he had to miss work today for this. Pt asked to see another provider. Pls advise.

## 2013-03-08 NOTE — Progress Notes (Signed)
  Subjective:    Patient ID: KREW HORTMAN, male    DOB: 1976-01-12, 37 y.o.   MRN: [id number]  HPI Christopher Krause is a 37 year old single male nonsmoker who comes in today for evaluation of allergic rhinitis  Typically and this spring his allergy symptoms are minimal and controlled by 10 mg of plain Zyrtec daily at bedtime. However in the last week they've been severe with head congestion cough sore throat drainage inability to sleep. He does not feel like he is wheezing but he's had a lot of coughing.   Review of Systems    review of systems negative he is a nonsmoker Objective:   Physical Exam  Well-developed overweight male no acute distress HEENT negative neck was supple no adenopathy lungs are clear      Assessment & Plan:  Allergic rhinitis plan prednisone burst and taper return when necessary

## 2013-03-09 ENCOUNTER — Ambulatory Visit: Payer: BC Managed Care – PPO | Admitting: Family Medicine

## 2013-08-06 ENCOUNTER — Ambulatory Visit: Payer: BC Managed Care – PPO | Admitting: Physical Therapy

## 2013-08-10 ENCOUNTER — Ambulatory Visit: Payer: BC Managed Care – PPO | Attending: Orthopaedic Surgery | Admitting: Physical Therapy

## 2013-08-10 DIAGNOSIS — R262 Difficulty in walking, not elsewhere classified: Secondary | ICD-10-CM | POA: Insufficient documentation

## 2013-08-10 DIAGNOSIS — R293 Abnormal posture: Secondary | ICD-10-CM | POA: Insufficient documentation

## 2013-08-10 DIAGNOSIS — M25569 Pain in unspecified knee: Secondary | ICD-10-CM | POA: Insufficient documentation

## 2013-08-10 DIAGNOSIS — R609 Edema, unspecified: Secondary | ICD-10-CM | POA: Insufficient documentation

## 2013-08-10 DIAGNOSIS — IMO0001 Reserved for inherently not codable concepts without codable children: Secondary | ICD-10-CM | POA: Insufficient documentation

## 2013-08-17 ENCOUNTER — Ambulatory Visit: Payer: BC Managed Care – PPO | Admitting: Physical Therapy

## 2013-08-19 ENCOUNTER — Ambulatory Visit: Payer: BC Managed Care – PPO | Attending: Orthopaedic Surgery | Admitting: Physical Therapy

## 2013-08-19 DIAGNOSIS — M25569 Pain in unspecified knee: Secondary | ICD-10-CM | POA: Insufficient documentation

## 2013-08-19 DIAGNOSIS — R293 Abnormal posture: Secondary | ICD-10-CM | POA: Insufficient documentation

## 2013-08-19 DIAGNOSIS — IMO0001 Reserved for inherently not codable concepts without codable children: Secondary | ICD-10-CM | POA: Insufficient documentation

## 2013-08-19 DIAGNOSIS — R262 Difficulty in walking, not elsewhere classified: Secondary | ICD-10-CM | POA: Insufficient documentation

## 2013-08-19 DIAGNOSIS — R609 Edema, unspecified: Secondary | ICD-10-CM | POA: Insufficient documentation

## 2013-08-24 ENCOUNTER — Ambulatory Visit: Payer: BC Managed Care – PPO | Admitting: Physical Therapy

## 2013-08-26 ENCOUNTER — Ambulatory Visit: Payer: BC Managed Care – PPO | Admitting: Physical Therapy

## 2013-09-01 ENCOUNTER — Ambulatory Visit: Payer: BC Managed Care – PPO | Admitting: Physical Therapy

## 2013-09-22 ENCOUNTER — Other Ambulatory Visit (HOSPITAL_COMMUNITY): Payer: Self-pay

## 2013-09-22 ENCOUNTER — Other Ambulatory Visit (HOSPITAL_COMMUNITY): Payer: Self-pay | Admitting: Orthopaedic Surgery

## 2013-09-24 ENCOUNTER — Encounter (HOSPITAL_COMMUNITY): Payer: Self-pay | Admitting: Pharmacy Technician

## 2013-09-27 NOTE — Patient Instructions (Addendum)
[address] Mao  09/27/2013   Your procedure is scheduled on: 09/30/13  Report to [facility] at 8:30 AM.  Call this number if you have problems the morning of surgery [phone]: [phone]   Remember:   Do not eat food or drink liquids After Midnight.     Take these medicines the morning of surgery with A SIP OF WATER: nexium   Do not wear jewelry, make-up or nail polish.  Do not wear lotions, powders, or perfumes. You may wear deodorant.  Do not shave 48 hours prior to surgery. Men may shave face and neck.  Do not bring valuables to the hospital.  Contacts, dentures or bridgework may not be worn into surgery.   Patients discharged the day of surgery will not be allowed to drive home.  Name and phone number of your driver: Corrie Dandy (mother)     Birdie Sons, RN  pre op nurse call if needed [phone]    FAILURE TO FOLLOW THESE INSTRUCTIONS MAY RESULT IN CANCELLATION OF YOUR SURGERY   Patient Signature: ___________________________________________

## 2013-09-28 ENCOUNTER — Encounter (HOSPITAL_COMMUNITY)
Admission: RE | Admit: 2013-09-28 | Discharge: 2013-09-28 | Disposition: A | Discharge status: Home / Self Care | Payer: BC Managed Care – PPO | Source: Ambulatory Visit | Attending: Orthopaedic Surgery | Admitting: Orthopaedic Surgery

## 2013-09-28 ENCOUNTER — Encounter (HOSPITAL_COMMUNITY): Payer: Self-pay

## 2013-09-28 HISTORY — DX: Gastro-esophageal reflux disease without esophagitis: K21.9

## 2013-09-28 HISTORY — DX: Unspecified osteoarthritis, unspecified site: M19.90

## 2013-09-28 LAB — CBC
HCT: 44.8 % (ref 39.0–52.0)
Hemoglobin: 14.7 g/dL (ref 13.0–17.0)
RBC: 4.79 MIL/uL (ref 4.22–5.81)
RDW: 12.8 % (ref 11.5–15.5)
WBC: 8.7 10*3/uL (ref 4.0–10.5)

## 2013-09-29 MED ORDER — DEXTROSE 5 % IV SOLN
3.0000 g | INTRAVENOUS | Status: DC
  Filled 2013-09-29 (×2): qty 3000

## 2013-09-30 ENCOUNTER — Encounter (HOSPITAL_COMMUNITY)
Admission: RE | Disposition: A | Discharge status: Home / Self Care | Payer: Self-pay | Source: Ambulatory Visit | Attending: Orthopaedic Surgery

## 2013-09-30 ENCOUNTER — Encounter (HOSPITAL_COMMUNITY): Payer: BC Managed Care – PPO | Admitting: Certified Registered Nurse Anesthetist

## 2013-09-30 ENCOUNTER — Ambulatory Visit (HOSPITAL_COMMUNITY): Payer: BC Managed Care – PPO | Admitting: Certified Registered Nurse Anesthetist

## 2013-09-30 ENCOUNTER — Encounter (HOSPITAL_COMMUNITY): Payer: Self-pay | Admitting: *Deleted

## 2013-09-30 ENCOUNTER — Ambulatory Visit (HOSPITAL_COMMUNITY)
Admission: RE | Admit: 2013-09-30 | Discharge: 2013-09-30 | Disposition: A | Discharge status: Home / Self Care | Payer: BC Managed Care – PPO | Source: Ambulatory Visit | Attending: Orthopaedic Surgery | Admitting: Orthopaedic Surgery

## 2013-09-30 DIAGNOSIS — S83206A Unspecified tear of unspecified meniscus, current injury, right knee, initial encounter: Secondary | ICD-10-CM

## 2013-09-30 DIAGNOSIS — K219 Gastro-esophageal reflux disease without esophagitis: Secondary | ICD-10-CM | POA: Insufficient documentation

## 2013-09-30 DIAGNOSIS — X58XXXA Exposure to other specified factors, initial encounter: Secondary | ICD-10-CM | POA: Insufficient documentation

## 2013-09-30 DIAGNOSIS — IMO0002 Reserved for concepts with insufficient information to code with codable children: Secondary | ICD-10-CM | POA: Insufficient documentation

## 2013-09-30 DIAGNOSIS — M2390 Unspecified internal derangement of unspecified knee: Secondary | ICD-10-CM | POA: Insufficient documentation

## 2013-09-30 DIAGNOSIS — M1710 Unilateral primary osteoarthritis, unspecified knee: Secondary | ICD-10-CM | POA: Insufficient documentation

## 2013-09-30 DIAGNOSIS — G4730 Sleep apnea, unspecified: Secondary | ICD-10-CM | POA: Insufficient documentation

## 2013-09-30 HISTORY — PX: KNEE ARTHROSCOPY: SHX127

## 2013-09-30 SURGERY — ARTHROSCOPY, KNEE
Anesthesia: General | Site: Knee | Laterality: Right | Wound class: Clean

## 2013-09-30 MED ORDER — BUPIVACAINE HCL (PF) 0.5 % IJ SOLN
INTRAMUSCULAR | Status: DC | PRN
  Administered 2013-09-30: 19 mL

## 2013-09-30 MED ORDER — PROPOFOL 10 MG/ML IV BOLUS
INTRAVENOUS | Status: DC | PRN
  Administered 2013-09-30: 50 mg via INTRAVENOUS
  Administered 2013-09-30: 300 mg via INTRAVENOUS

## 2013-09-30 MED ORDER — FENTANYL CITRATE 0.05 MG/ML IJ SOLN: 25.0000 ug | INTRAMUSCULAR | Status: DC | PRN

## 2013-09-30 MED ORDER — ONDANSETRON HCL 4 MG/2ML IJ SOLN
INTRAMUSCULAR | Status: DC | PRN
  Administered 2013-09-30: 4 mg via INTRAVENOUS

## 2013-09-30 MED ORDER — FENTANYL CITRATE 0.05 MG/ML IJ SOLN
INTRAMUSCULAR | Status: DC | PRN
  Administered 2013-09-30 (×4): 25 ug via INTRAVENOUS
  Administered 2013-09-30: 50 ug via INTRAVENOUS

## 2013-09-30 MED ORDER — KETOROLAC TROMETHAMINE 30 MG/ML IJ SOLN
INTRAMUSCULAR | Status: DC | PRN
  Administered 2013-09-30: 30 mg via INTRAVENOUS

## 2013-09-30 MED ORDER — MORPHINE SULFATE 4 MG/ML IJ SOLN
INTRAMUSCULAR | Status: AC
  Filled 2013-09-30: qty 1

## 2013-09-30 MED ORDER — LACTATED RINGERS IV SOLN
INTRAVENOUS | Status: DC
  Administered 2013-09-30: 1000 mL via INTRAVENOUS

## 2013-09-30 MED ORDER — BUPIVACAINE HCL (PF) 0.5 % IJ SOLN
INTRAMUSCULAR | Status: AC
  Filled 2013-09-30: qty 30

## 2013-09-30 MED ORDER — LACTATED RINGERS IR SOLN
Status: DC | PRN
  Administered 2013-09-30: 6000 mL

## 2013-09-30 MED ORDER — LACTATED RINGERS IV SOLN: INTRAVENOUS | Status: DC

## 2013-09-30 MED ORDER — KETAMINE HCL 10 MG/ML IJ SOLN
INTRAMUSCULAR | Status: DC | PRN
  Administered 2013-09-30: 20 mg via INTRAVENOUS

## 2013-09-30 MED ORDER — MORPHINE SULFATE 4 MG/ML IJ SOLN
INTRAMUSCULAR | Status: DC | PRN
  Administered 2013-09-30: 4 mg

## 2013-09-30 MED ORDER — OXYCODONE-ACETAMINOPHEN 5-325 MG PO TABS: 1.0000 | ORAL_TABLET | ORAL | Status: DC | PRN

## 2013-09-30 MED ORDER — ACETAMINOPHEN 10 MG/ML IV SOLN
1000.0000 mg | Freq: Once | INTRAVENOUS | Status: AC
  Administered 2013-09-30: 1000 mg via INTRAVENOUS
  Filled 2013-09-30: qty 100

## 2013-09-30 MED ORDER — MIDAZOLAM HCL 5 MG/5ML IJ SOLN
INTRAMUSCULAR | Status: DC | PRN
  Administered 2013-09-30: 2 mg via INTRAVENOUS

## 2013-09-30 MED ORDER — LIDOCAINE HCL (CARDIAC) 20 MG/ML IV SOLN
INTRAVENOUS | Status: DC | PRN
  Administered 2013-09-30: 100 mg via INTRAVENOUS

## 2013-09-30 SURGICAL SUPPLY — 27 items
BLADE CUDA SHAVER 3.5 (BLADE) ×2 IMPLANT
CLOTH BEACON ORANGE TIMEOUT ST (SAFETY) ×2 IMPLANT
COUNTER NEEDLE 20 DBL MAG RED (NEEDLE) ×2 IMPLANT
DRAPE U-SHAPE 47X51 STRL (DRAPES) ×2 IMPLANT
DURAPREP 26ML APPLICATOR (WOUND CARE) ×2 IMPLANT
GLOVE BIO SURGEON STRL SZ7.5 (GLOVE) ×6 IMPLANT
GLOVE BIOGEL PI IND STRL 8 (GLOVE) ×2 IMPLANT
GLOVE BIOGEL PI INDICATOR 8 (GLOVE) ×2
GLOVE ECLIPSE 8.0 STRL XLNG CF (GLOVE) ×2 IMPLANT
GOWN STRL REIN XL XLG (GOWN DISPOSABLE) ×6 IMPLANT
HOVERMATT SINGLE USE (MISCELLANEOUS) ×2 IMPLANT
IV LACTATED RINGER IRRG 3000ML (IV SOLUTION) ×2
IV LR IRRIG 3000ML ARTHROMATIC (IV SOLUTION) ×2 IMPLANT
MANIFOLD NEPTUNE II (INSTRUMENTS) ×2 IMPLANT
PACK ARTHROSCOPY WL (CUSTOM PROCEDURE TRAY) ×2 IMPLANT
PADDING CAST COTTON 6X4 STRL (CAST SUPPLIES) ×2 IMPLANT
POSITIONER SURGICAL ARM (MISCELLANEOUS) IMPLANT
SET ARTHROSCOPY TUBING (MISCELLANEOUS) ×1
SET ARTHROSCOPY TUBING LN (MISCELLANEOUS) ×1 IMPLANT
SUT DVC VLOC 180 2-0 12IN GS21 (SUTURE)
SUT ETHILON 4 0 PS 2 18 (SUTURE) ×2 IMPLANT
SUTURE DVC VL 180 2-0 12INGS21 (SUTURE) IMPLANT
SYR 20CC LL (SYRINGE) ×2 IMPLANT
TOWEL OR 17X26 10 PK STRL BLUE (TOWEL DISPOSABLE) ×2 IMPLANT
TUBING CONNECTING 10 (TUBING) ×2 IMPLANT
WAND 90 DEG TURBOVAC W/CORD (SURGICAL WAND) IMPLANT
WRAP KNEE MAXI GEL POST OP (GAUZE/BANDAGES/DRESSINGS) ×2 IMPLANT

## 2013-09-30 NOTE — Transfer of Care (Signed)
Immediate Anesthesia Transfer of Care Note  Patient: Christopher Krause  Procedure(s) Performed: Procedure(s) (LRB): RIGHT KNEE ARTHROSCOPY WITH PARTIAL MEDIAL MENISCECTOMY (Right)  Patient Location: PACU  Anesthesia Type: General  Level of Consciousness: awake, patient cooperative and responds to stimulation  Airway & Oxygen Therapy: Patient Spontanous Breathing and Patient connected to face mask oxgen  Post-op Assessment: Report given to PACU RN and Post -op Vital signs reviewed and stable  Post vital signs: Reviewed and stable  Complications: No apparent anesthesia complications

## 2013-09-30 NOTE — Brief Op Note (Signed)
09/30/2013  11:10 AM  PATIENT:  Christopher Krause  37 y.o. male  PRE-OPERATIVE DIAGNOSIS:  Right knee meniscal tear  POST-OPERATIVE DIAGNOSIS:  right knee meniscal tear  PROCEDURE:  Procedure(s): RIGHT KNEE ARTHROSCOPY WITH PARTIAL MEDIAL MENISCECTOMY (Right)  SURGEON:  Surgeon(s) and Role:    * Kathryne Hitch, MD - Primary  PHYSICIAN ASSISTANT: Rexene Edison, PA-C  ANESTHESIA:   local and general  EBL:     BLOOD ADMINISTERED:none  DRAINS: none   LOCAL MEDICATIONS USED:  MARCAINE     SPECIMEN:  No Specimen  DISPOSITION OF SPECIMEN:  N/A  COUNTS:  YES  TOURNIQUET:    DICTATION: .Other Dictation: Dictation Number [id number]  PLAN OF CARE: Discharge to home after PACU  PATIENT DISPOSITION:  PACU - hemodynamically stable.   Delay start of Pharmacological VTE agent (>24hrs) due to surgical blood loss or risk of bleeding: no
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
## 2013-09-30 NOTE — Preoperative (Signed)
Beta Blockers   Reason not to administer Beta Blockers:Not Applicable 

## 2013-09-30 NOTE — Anesthesia Postprocedure Evaluation (Signed)
  Anesthesia Post-op Note  Patient: Christopher Krause  Procedure(s) Performed: Procedure(s) (LRB): RIGHT KNEE ARTHROSCOPY WITH PARTIAL MEDIAL MENISCECTOMY (Right)  Patient Location: PACU  Anesthesia Type: General  Level of Consciousness: awake and alert   Airway and Oxygen Therapy: Patient Spontanous Breathing  Post-op Pain: mild  Post-op Assessment: Post-op Vital signs reviewed, Patient's Cardiovascular Status Stable, Respiratory Function Stable, Patent Airway and No signs of Nausea or vomiting  Last Vitals:  Filed Vitals:   09/30/13 1227  BP: 147/87  Pulse: 75  Temp:   Resp: 18    Post-op Vital Signs: stable   Complications: No apparent anesthesia complications

## 2013-09-30 NOTE — Anesthesia Preprocedure Evaluation (Addendum)
Anesthesia Evaluation  Patient identified by MRN, date of birth, ID band Patient awake    Reviewed: Allergy & Precautions, H&P , NPO status , Patient's Chart, lab work & pertinent test results  Airway Mallampati: III TM Distance: >3 FB Neck ROM: full    Dental no notable dental hx. (+) Teeth Intact and Dental Advisory Given   Pulmonary sleep apnea ,  breath sounds clear to auscultation  Pulmonary exam normal       Cardiovascular hypertension, Rhythm:regular Rate:Normal     Neuro/Psych negative neurological ROS  negative psych ROS   GI/Hepatic negative GI ROS, Neg liver ROS,   Endo/Other  negative endocrine ROSMorbid obesity  Renal/GU negative Renal ROS  negative genitourinary   Musculoskeletal   Abdominal (+) + obese,   Peds  Hematology negative hematology ROS (+)   Anesthesia Other Findings   Reproductive/Obstetrics negative OB ROS                          Anesthesia Physical Anesthesia Plan  ASA: III  Anesthesia Plan: General   Post-op Pain Management:    Induction: Intravenous  Airway Management Planned: LMA  Additional Equipment:   Intra-op Plan:   Post-operative Plan:   Informed Consent: I have reviewed the patients History and Physical, chart, labs and discussed the procedure including the risks, benefits and alternatives for the proposed anesthesia with the patient or authorized representative who has indicated his/her understanding and acceptance.   Dental Advisory Given  Plan Discussed with: CRNA and Surgeon  Anesthesia Plan Comments:         Anesthesia Quick Evaluation

## 2013-10-01 ENCOUNTER — Encounter (HOSPITAL_COMMUNITY): Payer: Self-pay | Admitting: Orthopaedic Surgery

## 2013-10-01 NOTE — Op Note (Signed)
NAMEMarland Kitchen  JSAON, YOO NO.:  [id number]  MEDICAL RECORD NO.:  [id number]  LOCATION:  [facility]                         FACILITY:  [facility]  PHYSICIAN:  Vanita Panda. Magnus Ivan, M.D.DATE OF BIRTH:  Sep 23, 1976  DATE OF PROCEDURE:  09/30/2013 DATE OF DISCHARGE:  09/30/2013                              OPERATIVE REPORT   PREOPERATIVE DIAGNOSES:  Right knee internal derangement with likely medial meniscal tear and medial compartmental arthritis and a morbidly obese individual.  POSTOPERATIVE DIAGNOSES:  Right knee internal derangement with likely medial meniscal tear and medial compartmental arthritis and a morbidly obese individual.  PROCEDURE:  Right knee arthroscopy with debridement of partial medial meniscectomy.  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  SURGEON:  Vanita Panda. Magnus Ivan, M.D.  ASSISTANT:  Richardean Canal, P.A.  ANESTHESIA: 1. General. 2. Local with 0.25% plain Marcaine mixed with morphine.  BLOOD LOSS:  Minimal.  COMPLICATIONS:  None.  INDICATIONS:  Christopher Krause is a 37 year old gentleman, I have known for long time.  He weighs about 500+ pounds and has had an acute injury to his right knee.  He complains of locking and catching in the knee with medial joint line tenderness.  We have tried numerous injections of the knee.  Rest, ice, heat and anti-inflammatories are not getting better. He was much too large to even attempt an MRI.  Due to the failure of conservative treatment, he wished to proceed with an arthroscopic intervention.  The risks and benefits of the surgery were explained to him in detail and he did wish to proceed with surgery.  PROCEDURE DESCRIPTION:  After informed consent was obtained, appropriate right knee was marked.  He was brought to the operating room and placed supine on the operating room table.  General anesthesia was  then obtained.  His right leg was prepped and draped with DuraPrep and sterile drapes including sterile stockinette.  A lateral leg post was utilized as well.  With the bed raised and right leg flexed off the side of the table, time-out was called to identify the correct patient and correct right knee.  We then made an anterolateral arthroscopy portal, inserted a cane in the knee and drained the large effusion from the knee, then to the medial compartment and made an anteromedial incision. We found the areas of full-thickness cartilage loss of his the medial femoral condyle and a macerated and torn posterior horn of midbody of medial meniscus.  We used an arthroscopic shaver to debride the cartilage back to the stable margin and then a soft arthroscopic shaver and up-cutting biters to perform a partial medial meniscectomy.  We then assessed the intercondylar area of the knee and found it to be intact as well the lateral compartment.  The trochlear groove and the patella had grade 3 to grade 4 chondromalacia and we debrided these as well.  We then removed all instruments from the knee and drained the fluid from the knee.  We closed the portal sites with interrupted nylon suture. Xeroform and well-padded sterile dressing was applied.  He was awakened, extubated and taken to the recovery room in  stable condition.  All final counts were correct.  There were no complications noted. Postoperatively, we will have him continue to increase his activities as he tolerates and we will see him back in the office in a week.     Vanita Panda. Magnus Ivan, M.D.     CYB/MEDQ  D:  09/30/2013  T:  10/01/2013  Job:  [id number]

## 2014-07-28 ENCOUNTER — Ambulatory Visit (INDEPENDENT_AMBULATORY_CARE_PROVIDER_SITE_OTHER): Payer: BC Managed Care – PPO

## 2014-07-28 ENCOUNTER — Ambulatory Visit (INDEPENDENT_AMBULATORY_CARE_PROVIDER_SITE_OTHER): Payer: BC Managed Care – PPO | Admitting: Emergency Medicine

## 2014-07-28 VITALS — BP 129/84 | HR 87 | Temp 97.9°F | Resp 16 | Ht 69.0 in

## 2014-07-28 DIAGNOSIS — S93409A Sprain of unspecified ligament of unspecified ankle, initial encounter: Secondary | ICD-10-CM

## 2014-07-28 DIAGNOSIS — M25579 Pain in unspecified ankle and joints of unspecified foot: Secondary | ICD-10-CM

## 2014-07-28 DIAGNOSIS — M25572 Pain in left ankle and joints of left foot: Secondary | ICD-10-CM

## 2014-07-28 DIAGNOSIS — K219 Gastro-esophageal reflux disease without esophagitis: Secondary | ICD-10-CM

## 2014-07-28 DIAGNOSIS — R112 Nausea with vomiting, unspecified: Secondary | ICD-10-CM

## 2014-07-28 LAB — COMPREHENSIVE METABOLIC PANEL
ALT: 18 U/L (ref 0–53)
AST: 18 U/L (ref 0–37)
Albumin: 4.1 g/dL (ref 3.5–5.2)
Alkaline Phosphatase: 72 U/L (ref 39–117)
BILIRUBIN TOTAL: 0.7 mg/dL (ref 0.2–1.2)
BUN: 9 mg/dL (ref 6–23)
CHLORIDE: 103 meq/L (ref 96–112)
CO2: 25 meq/L (ref 19–32)
Calcium: 9.3 mg/dL (ref 8.4–10.5)
Creat: 0.97 mg/dL (ref 0.50–1.35)
Glucose, Bld: 84 mg/dL (ref 70–99)
Potassium: 3.9 mEq/L (ref 3.5–5.3)
SODIUM: 140 meq/L (ref 135–145)
TOTAL PROTEIN: 6.8 g/dL (ref 6.0–8.3)

## 2014-07-28 LAB — AMYLASE: Amylase: 18 U/L (ref 0–105)

## 2014-07-28 LAB — POCT CBC
Granulocyte percent: 73.2 %G (ref 37–80)
HCT, POC: 44.5 % (ref 43.5–53.7)
Hemoglobin: 14.8 g/dL (ref 14.1–18.1)
Lymph, poc: 2.7 (ref 0.6–3.4)
MCH: 30.8 pg (ref 27–31.2)
MCHC: 33.2 g/dL (ref 31.8–35.4)
MCV: 92.6 fL (ref 80–97)
MID (CBC): 0.5 (ref 0–0.9)
MPV: 8.5 fL (ref 0–99.8)
PLATELET COUNT, POC: 254 10*3/uL (ref 142–424)
POC GRANULOCYTE: 8.8 — AB (ref 2–6.9)
POC LYMPH PERCENT: 22.8 %L (ref 10–50)
POC MID %: 4 % (ref 0–12)
RBC: 4.81 M/uL (ref 4.69–6.13)
RDW, POC: 13.2 %
WBC: 12 10*3/uL — AB (ref 4.6–10.2)

## 2014-07-28 LAB — LIPASE: Lipase: <10 U/L (ref 0–75)

## 2014-07-28 MED ORDER — SUCRALFATE 1 G PO TABS: ORAL_TABLET | ORAL | Status: DC

## 2014-07-28 MED ORDER — ESOMEPRAZOLE MAGNESIUM 40 MG PO CPDR: 40.0000 mg | DELAYED_RELEASE_CAPSULE | Freq: Every day | ORAL | Status: DC
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
## 2014-07-28 NOTE — Patient Instructions (Signed)
Gastroesophageal Reflux Disease, Adult Gastroesophageal reflux disease (GERD) happens when acid from your stomach flows up into the esophagus. When acid comes in contact with the esophagus, the acid causes soreness (inflammation) in the esophagus. Over time, GERD may create small holes (ulcers) in the lining of the esophagus. CAUSES   Increased body weight. This puts pressure on the stomach, making acid rise from the stomach into the esophagus.  Smoking. This increases acid production in the stomach.  Drinking alcohol. This causes decreased pressure in the lower esophageal sphincter (valve or ring of muscle between the esophagus and stomach), allowing acid from the stomach into the esophagus.  Late evening meals and a full stomach. This increases pressure and acid production in the stomach.  A malformed lower esophageal sphincter. Sometimes, no cause is found. SYMPTOMS   Burning pain in the lower part of the mid-chest behind the breastbone and in the mid-stomach area. This may occur twice a week or more often.  Trouble swallowing.  Sore throat.  Dry cough.  Asthma-like symptoms including chest tightness, shortness of breath, or wheezing. DIAGNOSIS  Your caregiver may be able to diagnose GERD based on your symptoms. In some cases, X-rays and other tests may be done to check for complications or to check the condition of your stomach and esophagus. TREATMENT  Your caregiver may recommend over-the-counter or prescription medicines to help decrease acid production. Ask your caregiver before starting or adding any new medicines.  HOME CARE INSTRUCTIONS   Change the factors that you can control. Ask your caregiver for guidance concerning weight loss, quitting smoking, and alcohol consumption.  Avoid foods and drinks that make your symptoms worse, such as:  Caffeine or alcoholic drinks.  Chocolate.  Peppermint or mint flavorings.  Garlic and onions.  Spicy foods.  Citrus fruits,  such as oranges, lemons, or limes.  Tomato-based foods such as sauce, chili, salsa, and pizza.  Fried and fatty foods.  Avoid lying down for the 3 hours prior to your bedtime or prior to taking a nap.  Eat small, frequent meals instead of large meals.  Wear loose-fitting clothing. Do not wear anything tight around your waist that causes pressure on your stomach.  Raise the head of your bed 6 to 8 inches with wood blocks to help you sleep. Extra pillows will not help.  Only take over-the-counter or prescription medicines for pain, discomfort, or fever as directed by your caregiver.  Do not take aspirin, ibuprofen, or other nonsteroidal anti-inflammatory drugs (NSAIDs). SEEK IMMEDIATE MEDICAL CARE IF:   You have pain in your arms, neck, jaw, teeth, or back.  Your pain increases or changes in intensity or duration.  You develop nausea, vomiting, or sweating (diaphoresis).  You develop shortness of breath, or you faint.  Your vomit is green, yellow, black, or looks like coffee grounds or blood.  Your stool is red, bloody, or black. These symptoms could be signs of other problems, such as heart disease, gastric bleeding, or esophageal bleeding. MAKE SURE YOU:   Understand these instructions.  Will watch your condition.  Will get help right away if you are not doing well or get worse. Document Released: 08/14/2005 Document Revised: 01/27/2012 Document Reviewed: 05/24/2011 Exit[employer] Patient Information 2015 [employer]. This information is not intended to replace advice given to you by your health care provider. Make sure you discuss any questions you have with your health care provider.  

## 2014-07-29 LAB — H. PYLORI ANTIBODY, IGG: H Pylori IgG: 0.69 {ISR}

## 2014-09-13 ENCOUNTER — Ambulatory Visit: Payer: BC Managed Care – PPO | Attending: Orthopaedic Surgery

## 2014-09-13 DIAGNOSIS — M545 Low back pain: Secondary | ICD-10-CM | POA: Insufficient documentation

## 2014-09-13 DIAGNOSIS — I10 Essential (primary) hypertension: Secondary | ICD-10-CM | POA: Diagnosis not present

## 2014-09-13 DIAGNOSIS — E669 Obesity, unspecified: Secondary | ICD-10-CM | POA: Insufficient documentation

## 2014-09-13 DIAGNOSIS — Z5189 Encounter for other specified aftercare: Secondary | ICD-10-CM | POA: Insufficient documentation

## 2014-09-15 ENCOUNTER — Ambulatory Visit: Payer: BC Managed Care – PPO | Admitting: Physical Therapy

## 2014-09-15 DIAGNOSIS — Z5189 Encounter for other specified aftercare: Secondary | ICD-10-CM | POA: Diagnosis not present

## 2014-09-27 ENCOUNTER — Ambulatory Visit: Payer: BC Managed Care – PPO | Admitting: Physical Therapy

## 2014-10-04 ENCOUNTER — Telehealth: Payer: Self-pay | Admitting: *Deleted

## 2014-10-04 ENCOUNTER — Ambulatory Visit: Payer: BC Managed Care – PPO | Attending: Orthopaedic Surgery | Admitting: Physical Therapy

## 2014-10-04 DIAGNOSIS — Z5189 Encounter for other specified aftercare: Secondary | ICD-10-CM | POA: Insufficient documentation

## 2014-10-04 DIAGNOSIS — I10 Essential (primary) hypertension: Secondary | ICD-10-CM | POA: Insufficient documentation

## 2014-10-04 DIAGNOSIS — M545 Low back pain, unspecified: Secondary | ICD-10-CM

## 2014-10-04 DIAGNOSIS — E669 Obesity, unspecified: Secondary | ICD-10-CM | POA: Diagnosis not present

## 2014-10-04 NOTE — Telephone Encounter (Signed)
appts made and printed...td 
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
## 2014-10-06 ENCOUNTER — Encounter: Payer: BC Managed Care – PPO | Admitting: Physical Therapy

## 2014-10-17 ENCOUNTER — Ambulatory Visit: Payer: BC Managed Care – PPO | Admitting: Physical Therapy

## 2014-10-18 ENCOUNTER — Ambulatory Visit: Payer: BC Managed Care – PPO | Admitting: Physical Therapy

## 2014-10-19 ENCOUNTER — Encounter: Payer: BC Managed Care – PPO | Admitting: Rehabilitation

## 2014-10-20 ENCOUNTER — Ambulatory Visit: Payer: BC Managed Care – PPO | Attending: Orthopaedic Surgery | Admitting: Physical Therapy

## 2014-10-20 ENCOUNTER — Encounter: Payer: BC Managed Care – PPO | Admitting: Physical Therapy

## 2014-10-20 DIAGNOSIS — M545 Low back pain, unspecified: Secondary | ICD-10-CM

## 2014-10-20 DIAGNOSIS — Z5189 Encounter for other specified aftercare: Secondary | ICD-10-CM | POA: Insufficient documentation

## 2014-10-20 DIAGNOSIS — E669 Obesity, unspecified: Secondary | ICD-10-CM | POA: Insufficient documentation

## 2014-10-20 DIAGNOSIS — I10 Essential (primary) hypertension: Secondary | ICD-10-CM | POA: Diagnosis not present
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
## 2014-10-25 ENCOUNTER — Ambulatory Visit: Payer: BC Managed Care – PPO | Admitting: Physical Therapy

## 2014-10-28 ENCOUNTER — Ambulatory Visit: Payer: BC Managed Care – PPO | Admitting: Physical Therapy

## 2014-11-01 ENCOUNTER — Ambulatory Visit: Payer: BC Managed Care – PPO | Admitting: Physical Therapy

## 2014-11-03 ENCOUNTER — Encounter: Payer: BC Managed Care – PPO | Admitting: Physical Therapy

## 2014-12-05 ENCOUNTER — Other Ambulatory Visit: Payer: Self-pay | Admitting: Emergency Medicine

## 2014-12-30 ENCOUNTER — Ambulatory Visit (INDEPENDENT_AMBULATORY_CARE_PROVIDER_SITE_OTHER): Payer: BLUE CROSS/BLUE SHIELD | Admitting: Physician Assistant

## 2014-12-30 VITALS — BP 134/72 | HR 99 | Temp 98.2°F | Resp 18 | Ht 70.5 in

## 2014-12-30 DIAGNOSIS — J069 Acute upper respiratory infection, unspecified: Secondary | ICD-10-CM

## 2014-12-30 MED ORDER — IPRATROPIUM BROMIDE 0.03 % NA SOLN: 2.0000 | Freq: Two times a day (BID) | NASAL | Status: DC

## 2014-12-30 MED ORDER — GUAIFENESIN ER 1200 MG PO TB12: 1.0000 | ORAL_TABLET | Freq: Two times a day (BID) | ORAL | Status: DC | PRN

## 2014-12-30 NOTE — Patient Instructions (Signed)
Upper Respiratory Infection, Adult An upper respiratory infection (URI) is also sometimes known as the common cold. The upper respiratory tract includes the nose, sinuses, throat, trachea, and bronchi. Bronchi are the airways leading to the lungs. Most people improve within 1 week, but symptoms can last up to 2 weeks. A residual cough may last even longer.  CAUSES Many different viruses can infect the tissues lining the upper respiratory tract. The tissues become irritated and inflamed and often become very moist. Mucus production is also common. A cold is contagious. You can easily spread the virus to others by oral contact. This includes kissing, sharing a glass, coughing, or sneezing. Touching your mouth or nose and then touching a surface, which is then touched by another person, can also spread the virus. SYMPTOMS  Symptoms typically develop 1 to 3 days after you come in contact with a cold virus. Symptoms vary from person to person. They may include:  Runny nose.  Sneezing.  Nasal congestion.  Sinus irritation.  Sore throat.  Loss of voice (laryngitis).  Cough.  Fatigue.  Muscle aches.  Loss of appetite.  Headache.  Low-grade fever. DIAGNOSIS  You might diagnose your own cold based on familiar symptoms, since most people get a cold 2 to 3 times a year. Your caregiver can confirm this based on your exam. Most importantly, your caregiver can check that your symptoms are not due to another disease such as strep throat, sinusitis, pneumonia, asthma, or epiglottitis. Blood tests, throat tests, and X-rays are not necessary to diagnose a common cold, but they may sometimes be helpful in excluding other more serious diseases. Your caregiver will decide if any further tests are required. RISKS AND COMPLICATIONS  You may be at risk for a more severe case of the common cold if you smoke cigarettes, have chronic heart disease (such as heart failure) or lung disease (such as asthma), or if  you have a weakened immune system. The very young and very old are also at risk for more serious infections. Bacterial sinusitis, middle ear infections, and bacterial pneumonia can complicate the common cold. The common cold can worsen asthma and chronic obstructive pulmonary disease (COPD). Sometimes, these complications can require emergency medical care and may be life-threatening. PREVENTION  The best way to protect against getting a cold is to practice good hygiene. Avoid oral or hand contact with people with cold symptoms. Wash your hands often if contact occurs. There is no clear evidence that vitamin C, vitamin E, echinacea, or exercise reduces the chance of developing a cold. However, it is always recommended to get plenty of rest and practice good nutrition. TREATMENT  Treatment is directed at relieving symptoms. There is no cure. Antibiotics are not effective, because the infection is caused by a virus, not by bacteria. Treatment may include:  Increased fluid intake. Sports drinks offer valuable electrolytes, sugars, and fluids.  Breathing heated mist or steam (vaporizer or shower).  Eating chicken soup or other clear broths, and maintaining good nutrition.  Getting plenty of rest.  Using gargles or lozenges for comfort.  Controlling fevers with ibuprofen or acetaminophen as directed by your caregiver.  Increasing usage of your inhaler if you have asthma. Zinc gel and zinc lozenges, taken in the first 24 hours of the common cold, can shorten the duration and lessen the severity of symptoms. Pain medicines may help with fever, muscle aches, and throat pain. A variety of non-prescription medicines are available to treat congestion and runny nose. Your caregiver   can make recommendations and may suggest nasal or lung inhalers for other symptoms.  HOME CARE INSTRUCTIONS   Only take over-the-counter or prescription medicines for pain, discomfort, or fever as directed by your  caregiver.  Use a warm mist humidifier or inhale steam from a shower to increase air moisture. This may keep secretions moist and make it easier to breathe.  Drink enough water and fluids to keep your urine clear or pale yellow.  Rest as needed.  Return to work when your temperature has returned to normal or as your caregiver advises. You may need to stay home longer to avoid infecting others. You can also use a face mask and careful hand washing to prevent spread of the virus. SEEK MEDICAL CARE IF:   After the first few days, you feel you are getting worse rather than better.  You need your caregiver's advice about medicines to control symptoms.  You develop chills, worsening shortness of breath, or brown or red sputum. These may be signs of pneumonia.  You develop yellow or brown nasal discharge or pain in the face, especially when you bend forward. These may be signs of sinusitis.  You develop a fever, swollen neck glands, pain with swallowing, or white areas in the back of your throat. These may be signs of strep throat. SEEK IMMEDIATE MEDICAL CARE IF:   You have a fever.  You develop severe or persistent headache, ear pain, sinus pain, or chest pain.  You develop wheezing, a prolonged cough, cough up blood, or have a change in your usual mucus (if you have chronic lung disease).  You develop sore muscles or a stiff neck. Document Released: 04/30/2001 Document Revised: 01/27/2012 Document Reviewed: 02/09/2014 Exit[employer] Patient Information 2015 [employer]. This information is not intended to replace advice given to you by your health care provider. Make sure you discuss any questions you have with your health care provider.  

## 2014-12-30 NOTE — Progress Notes (Signed)
   Subjective:    Patient ID: Christopher Krause, male    DOB: 11/06/1976, 39 y.o.   MRN: [id number]  HPI Patient presents for 5 days of productive cough and sore throat. Sx are accompanied with nasal congestion, diarrhea, sinus pressure, HA, wheezing, rhinorrhea, and feeling week. Also had fatigue, diarrhea, fever, and chills that have improved. Denies N/V, ear pressure, chest tightness, or CP. Has tried tylenol cold and cough drops with minimal relief. Father has been sick as well. Denies h/o asthma. NKDA.    Review of Systems  Constitutional: Negative for fever, chills, activity change, appetite change and fatigue.  HENT: Positive for congestion, rhinorrhea, sinus pressure and sore throat. Negative for ear discharge, ear pain, postnasal drip and sneezing.   Respiratory: Positive for cough and wheezing. Negative for chest tightness and shortness of breath.   Cardiovascular: Negative for chest pain.  Gastrointestinal: Positive for diarrhea. Negative for nausea, vomiting and abdominal pain.  Allergic/Immunologic: Negative for environmental allergies and food allergies.  Neurological: Positive for headaches. Negative for dizziness and light-headedness.       Objective:   Physical Exam  Constitutional: He is oriented to person, place, and time. He appears well-developed and well-nourished. No distress.  Blood pressure 134/72, pulse 99, temperature 98.2 F (36.8 C), temperature source Oral, resp. rate 18, height 5' 10.5" (1.791 m), SpO2 92 %.  HENT:  Head: Normocephalic and atraumatic.  Right Ear: Tympanic membrane, external ear and ear canal normal.  Left Ear: Tympanic membrane, external ear and ear canal normal.  Nose: Rhinorrhea (with mild erythema) present. No mucosal edema, nose lacerations or sinus tenderness. Right sinus exhibits no maxillary sinus tenderness and no frontal sinus tenderness. Left sinus exhibits no maxillary sinus tenderness and no frontal sinus tenderness.    Mouth/Throat: Uvula is midline, oropharynx is clear and moist and mucous membranes are normal. No oropharyngeal exudate, posterior oropharyngeal edema, posterior oropharyngeal erythema or tonsillar abscesses.  Eyes: Conjunctivae are normal. Pupils are equal, round, and reactive to light. Right eye exhibits no discharge. Left eye exhibits no discharge. No scleral icterus.  Neck: Normal range of motion. Neck supple. No thyromegaly present.  Cardiovascular: Normal rate, regular rhythm and normal heart sounds.  Exam reveals no gallop and no friction rub.   No murmur heard. Pulmonary/Chest: Effort normal and breath sounds normal. No respiratory distress. He has no decreased breath sounds. He has no wheezes. He has no rhonchi. He has no rales. He exhibits no tenderness.  Lymphadenopathy:    He has no cervical adenopathy.  Neurological: He is alert and oriented to person, place, and time.  Skin: Skin is dry. He is not diaphoretic. No erythema. No pallor.       Assessment & Plan:  1. Acute upper respiratory infection Take Mucinex with plenty of fluid. - ipratropium (ATROVENT) 0.03 % nasal spray; Place 2 sprays into both nostrils 2 (two) times daily.  Dispense: 30 mL; Refill: 0 - Guaifenesin (MUCINEX MAXIMUM STRENGTH) 1200 MG TB12; Take 1 tablet (1,200 mg total) by mouth every 12 (twelve) hours as needed.  Dispense: 14 tablet; Refill: 1   Ana Liaw PA-C  [facility] 12/30/2014 12:21 PM

## 2015-01-08 ENCOUNTER — Other Ambulatory Visit: Payer: Self-pay | Admitting: Physician Assistant

## 2015-01-12 ENCOUNTER — Other Ambulatory Visit: Payer: Self-pay | Admitting: Physician Assistant

## 2015-01-16 ENCOUNTER — Encounter: Payer: Self-pay | Admitting: Family Medicine

## 2015-01-16 ENCOUNTER — Ambulatory Visit (INDEPENDENT_AMBULATORY_CARE_PROVIDER_SITE_OTHER): Payer: BLUE CROSS/BLUE SHIELD | Admitting: Family Medicine

## 2015-01-16 DIAGNOSIS — K2100 Gastro-esophageal reflux disease with esophagitis, without bleeding: Secondary | ICD-10-CM

## 2015-01-16 DIAGNOSIS — Z0000 Encounter for general adult medical examination without abnormal findings: Secondary | ICD-10-CM

## 2015-01-16 DIAGNOSIS — I10 Essential (primary) hypertension: Secondary | ICD-10-CM

## 2015-01-16 DIAGNOSIS — Z23 Encounter for immunization: Secondary | ICD-10-CM

## 2015-01-16 LAB — HEPATIC FUNCTION PANEL
ALK PHOS: 71 U/L (ref 39–117)
ALT: 23 U/L (ref 0–53)
AST: 17 U/L (ref 0–37)
Albumin: 3.8 g/dL (ref 3.5–5.2)
BILIRUBIN DIRECT: 0.1 mg/dL (ref 0.0–0.3)
Total Bilirubin: 0.4 mg/dL (ref 0.2–1.2)
Total Protein: 6.9 g/dL (ref 6.0–8.3)

## 2015-01-16 LAB — CBC WITH DIFFERENTIAL/PLATELET
BASOS PCT: 0.6 % (ref 0.0–3.0)
Basophils Absolute: 0.1 10*3/uL (ref 0.0–0.1)
Eosinophils Absolute: 0.1 10*3/uL (ref 0.0–0.7)
Eosinophils Relative: 1.3 % (ref 0.0–5.0)
HCT: 44.8 % (ref 39.0–52.0)
Hemoglobin: 15 g/dL (ref 13.0–17.0)
Lymphocytes Relative: 21.3 % (ref 12.0–46.0)
Lymphs Abs: 2 10*3/uL (ref 0.7–4.0)
MCHC: 33.5 g/dL (ref 30.0–36.0)
MCV: 90.7 fl (ref 78.0–100.0)
MONOS PCT: 7.6 % (ref 3.0–12.0)
Monocytes Absolute: 0.7 10*3/uL (ref 0.1–1.0)
NEUTROS ABS: 6.6 10*3/uL (ref 1.4–7.7)
Neutrophils Relative %: 69.2 % (ref 43.0–77.0)
Platelets: 277 10*3/uL (ref 150.0–400.0)
RBC: 4.94 Mil/uL (ref 4.22–5.81)
RDW: 13.3 % (ref 11.5–15.5)
WBC: 9.5 10*3/uL (ref 4.0–10.5)

## 2015-01-16 LAB — BASIC METABOLIC PANEL
BUN: 16 mg/dL (ref 6–23)
CHLORIDE: 105 meq/L (ref 96–112)
CO2: 27 meq/L (ref 19–32)
Calcium: 9.1 mg/dL (ref 8.4–10.5)
Creatinine, Ser: 1.07 mg/dL (ref 0.40–1.50)
GFR: 99.25 mL/min (ref >60.00)
Glucose, Bld: 95 mg/dL (ref 70–99)
POTASSIUM: 4.7 meq/L (ref 3.5–5.1)
SODIUM: 139 meq/L (ref 135–145)

## 2015-01-16 LAB — LIPID PANEL
CHOLESTEROL: 162 mg/dL (ref 0–200)
HDL: 55.8 mg/dL (ref >39.00)
LDL Cholesterol: 95 mg/dL (ref 0–99)
NONHDL: 106.2
Total CHOL/HDL Ratio: 3
Triglycerides: 58 mg/dL (ref 0.0–149.0)
VLDL: 11.6 mg/dL (ref 0.0–40.0)

## 2015-01-16 LAB — HEMOGLOBIN A1C: Hgb A1c MFr Bld: 5.6 % (ref 4.6–6.5)

## 2015-01-16 LAB — TSH: TSH: 2.22 u[IU]/mL (ref 0.35–4.50)

## 2015-01-16 NOTE — Progress Notes (Signed)
   Subjective:    Patient ID: Christopher Krause, male    DOB: 06/27/1976, 39 y.o.   MRN: [id number]  HPI Christopher Krause is a 39 year old single male nonsmoker who comes in today for general physical examination  His weight is 550 pounds he's 70-1/4 inches tall. When he was 18 he weighed about 230 pounds. He says he is not following a diet however he does work at [employer] and he walks a lot. 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 A couple years ago we send him to Dr. Daphine DeutscherMartin about the gastric bypass. 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  Routine eye care every 3 years, regular dental care, colonoscopy not too age 39, vaccinations up-to-date   Review of Systems  Constitutional: Negative.   HENT: Negative.   Eyes: Negative.   Respiratory: Negative.   Cardiovascular: Negative.   Gastrointestinal: Negative.   Endocrine: Negative.   Genitourinary: Negative.   Musculoskeletal: Negative.   Skin: Negative.   Allergic/Immunologic: Negative.   Neurological: Negative.   Hematological: Negative.   Psychiatric/Behavioral: Negative.        Objective:   Physical Exam  Constitutional: He is oriented to person, place, and time. He appears well-developed and well-nourished.  HENT:  Head: Normocephalic and atraumatic.  Right Ear: External ear normal.  Left Ear: External ear normal.  Nose: Nose normal.  Mouth/Throat: Oropharynx is clear and moist.  Eyes: Conjunctivae and EOM are normal. Pupils are equal, round, and reactive to light.  Neck: Normal range of motion. Neck supple. No JVD present. No tracheal deviation present. No thyromegaly present.  Cardiovascular: Normal rate, regular rhythm, normal heart sounds and intact distal pulses.  Exam reveals no gallop and no friction rub.   No murmur heard. Pulmonary/Chest: Effort normal and breath sounds normal. No stridor.  No respiratory distress. He has no wheezes. He has no rales. He exhibits no tenderness.  Abdominal: Soft. Bowel sounds are normal. He exhibits no distension and no mass. There is no tenderness. There is no rebound and no guarding.  Musculoskeletal: Normal range of motion. He exhibits no edema or tenderness.  Lymphadenopathy:    He has no cervical adenopathy.  Neurological: He is alert and oriented to person, place, and time. He has normal reflexes. No cranial nerve deficit. He exhibits normal muscle tone.  Skin: Skin is warm and dry. No rash noted. No erythema. No pallor.  Psychiatric: He has a normal mood and affect. His behavior is normal. Judgment and thought content normal.          Assessment & Plan:  Morbid obesity,,,,,,,,,,, discussed dietary changes no carbs no fat,,,,,, protein,,, exercise,,,,,,,, consult again with Dr. Daphine DeutscherMartin

## 2015-01-16 NOTE — Patient Instructions (Signed)
Labs today  Diet,,,,,,,,, no fat no sugar,,,,,,,,, 32 ounces of water daily,,,,,,, walk 30 minutes above what you normally do at work,,,,,,,, call the general surgery office and aspirin appointment to see Dr. Wenda LowMatt Martin about the gastric bypass. There phone number is [phone]

## 2015-01-16 NOTE — Progress Notes (Signed)
Pre visit review using our clinic review tool, if applicable. No additional management support is needed unless otherwise documented below in the visit note. 

## 2015-01-17 ENCOUNTER — Telehealth: Payer: Self-pay | Admitting: Family Medicine

## 2015-01-17 NOTE — Telephone Encounter (Signed)
emmi mailed  °

## 2015-02-22 ENCOUNTER — Other Ambulatory Visit: Payer: Self-pay | Admitting: Family Medicine

## 2015-03-11 ENCOUNTER — Other Ambulatory Visit: Payer: Self-pay | Admitting: Family Medicine

## 2015-03-31 ENCOUNTER — Encounter: Payer: Self-pay | Admitting: Adult Health

## 2015-03-31 ENCOUNTER — Ambulatory Visit (INDEPENDENT_AMBULATORY_CARE_PROVIDER_SITE_OTHER): Payer: BLUE CROSS/BLUE SHIELD | Admitting: Adult Health

## 2015-03-31 VITALS — BP 130/84 | Temp 98.4°F | Ht 70.5 in | Wt >= 6400 oz

## 2015-03-31 DIAGNOSIS — M25572 Pain in left ankle and joints of left foot: Secondary | ICD-10-CM | POA: Diagnosis not present

## 2015-03-31 DIAGNOSIS — M5441 Lumbago with sciatica, right side: Secondary | ICD-10-CM

## 2015-03-31 MED ORDER — KETOROLAC TROMETHAMINE 60 MG/2ML IM SOLN
60.0000 mg | Freq: Once | INTRAMUSCULAR | Status: AC
  Administered 2015-03-31: 60 mg via INTRAMUSCULAR

## 2015-03-31 MED ORDER — METHYLPREDNISOLONE ACETATE 80 MG/ML IJ SUSP
120.0000 mg | Freq: Once | INTRAMUSCULAR | Status: AC
Start: 2015-03-31 — End: 2015-03-31
  Administered 2015-03-31: 120 mg via INTRAMUSCULAR

## 2015-03-31 MED ORDER — METHOCARBAMOL 750 MG PO TABS: 750.0000 mg | ORAL_TABLET | Freq: Four times a day (QID) | ORAL | Status: DC | PRN

## 2015-03-31 NOTE — Progress Notes (Signed)
Pre visit review using our clinic review tool, if applicable. No additional management support is needed unless otherwise documented below in the visit note. 

## 2015-03-31 NOTE — Patient Instructions (Addendum)
Follow up at the Radiology department in [facility] for your x-ray of the left ankle. I will let you know what the report shows. Continue to ice the area, keep it elevated and wrapped.    For your lower back pain, it appears to be muscular in nature. The steroid and Toradol injection should help with the pain and inflammation. You can take the Robaxin every six hours as needed. It should not make you sleepy, but try it when you are going to be home and not driving, just to make sure. If you are not feeling any better in a few days then please follow up.  Ankle Pain Ankle pain is a common symptom. The bones, cartilage, tendons, and muscles of the ankle joint perform a lot of work each day. The ankle joint holds your body weight and allows you to move around. Ankle pain can occur on either side or back of 1 or both ankles. Ankle pain may be sharp and burning or dull and aching. There may be tenderness, stiffness, redness, or warmth around the ankle. The pain occurs more often when a person walks or puts pressure on the ankle. CAUSES  There are many reasons ankle pain can develop. It is important to work with your caregiver to identify the cause since many conditions can impact the bones, cartilage, muscles, and tendons. Causes for ankle pain include:  Injury, including a break (fracture), sprain, or strain often due to a fall, sports, or a high-impact activity.  Swelling (inflammation) of a tendon (tendonitis).  Achilles tendon rupture.  Ankle instability after repeated sprains and strains.  Poor foot alignment.  Pressure on a nerve (tarsal tunnel syndrome).  Arthritis in the ankle or the lining of the ankle.  Crystal formation in the ankle (gout or pseudogout). DIAGNOSIS  A diagnosis is based on your medical history, your symptoms, results of your physical exam, and results of diagnostic tests. Diagnostic tests may include X-ray exams or a computerized magnetic scan (magnetic resonance  imaging, MRI). TREATMENT  Treatment will depend on the cause of your ankle pain and may include:  Keeping pressure off the ankle and limiting activities.  Using crutches or other walking support (a cane or brace).  Using rest, ice, compression, and elevation.  Participating in physical therapy or home exercises.  Wearing shoe inserts or special shoes.  Losing weight.  Taking medications to reduce pain or swelling or receiving an injection.  Undergoing surgery. HOME CARE INSTRUCTIONS   Only take over-the-counter or prescription medicines for pain, discomfort, or fever as directed by your caregiver.  Put ice on the injured area.  Put ice in a plastic bag.  Place a towel between your skin and the bag.  Leave the ice on for 15-20 minutes at a time, 03-04 times a day.  Keep your leg raised (elevated) when possible to lessen swelling.  Avoid activities that cause ankle pain.  Follow specific exercises as directed by your caregiver.  Record how often you have ankle pain, the location of the pain, and what it feels like. This information may be helpful to you and your caregiver.  Ask your caregiver about returning to work or sports and whether you should drive.  Follow up with your caregiver for further examination, therapy, or testing as directed. SEEK MEDICAL CARE IF:   Pain or swelling continues or worsens beyond 1 week.  You have an oral temperature above 102 F (38.9 C).  You are feeling unwell or have chills.  You are having an increasingly difficult time with walking.  You have loss of sensation or other new symptoms.  You have questions or concerns. MAKE SURE YOU:   Understand these instructions.  Will watch your condition.  Will get help right away if you are not doing well or get worse. Document Released: 04/24/2010 Document Revised: 01/27/2012 Document Reviewed: 04/24/2010 [facility] Patient Information 2015 [place] Manor[employer]Care, [place]. This information is not  intended to replace advice given to you by your health care provider. Make sure you discuss any questions you have with your health care provider. Back Pain, Adult Low back pain is very common. About 1 in 5 people have back pain.The cause of low back pain is rarely dangerous. The pain often gets better over time.About half of people with a sudden onset of back pain feel better in just 2 weeks. About 8 in 10 people feel better by 6 weeks.  CAUSES Some common causes of back pain include:  Strain of the muscles or ligaments supporting the spine.  Wear and tear (degeneration) of the spinal discs.  Arthritis.  Direct injury to the back. DIAGNOSIS Most of the time, the direct cause of low back pain is not known.However, back pain can be treated effectively even when the exact cause of the pain is unknown.Answering your caregiver's questions about your overall health and symptoms is one of the most accurate ways to make sure the cause of your pain is not dangerous. If your caregiver needs more information, he or she may order lab work or imaging tests (X-rays or MRIs).However, even if imaging tests show changes in your back, this usually does not require surgery. HOME CARE INSTRUCTIONS For many people, back pain returns.Since low back pain is rarely dangerous, it is often a condition that people can learn to [facility] Sur[facility]ymanageon their own.   Remain active. It is stressful on the back to sit or stand in one place. Do not sit, drive, or stand in one place for more than 30 minutes at a time. Take short walks on level surfaces as soon as pain allows.Try to increase the length of time you walk each day.  Do not stay in bed.Resting more than 1 or 2 days can delay your recovery.  Do not avoid exercise or work.Your body is made to move.It is not dangerous to be active, even though your back may hurt.Your back will likely heal faster if you return to being active before your pain is gone.  Pay attention to  your body when you bend and lift. Many people have less discomfortwhen lifting if they bend their knees, keep the load close to their bodies,and avoid twisting. Often, the most comfortable positions are those that put less stress on your recovering back.  Find a comfortable position to sleep. Use a firm mattress and lie on your side with your knees slightly bent. If you lie on your back, put a pillow under your knees.  Only take over-the-counter or prescription medicines as directed by your caregiver. Over-the-counter medicines to reduce pain and inflammation are often the most helpful.Your caregiver may prescribe muscle relaxant drugs.These medicines help dull your pain so you can more quickly return to your normal activities and healthy exercise.  Put ice on the injured area.  Put ice in a plastic bag.  Place a towel between your skin and the bag.  Leave the ice on for 15-20 minutes, 03-04 times a day for the first 2 to 3 days. After that, ice and heat may  be alternated to reduce pain and spasms.  Ask your caregiver about trying back exercises and gentle massage. This may be of some benefit.  Avoid feeling anxious or stressed.Stress increases muscle tension and can worsen back pain.It is important to recognize when you are anxious or stressed and learn ways to manage it.Exercise is a great option. SEEK MEDICAL CARE IF:  You have pain that is not relieved with rest or medicine.  You have pain that does not improve in 1 week.  You have new symptoms.  You are generally not feeling well. SEEK IMMEDIATE MEDICAL CARE IF:   You have pain that radiates from your back into your legs.  You develop new bowel or bladder control problems.  You have unusual weakness or numbness in your arms or legs.  You develop nausea or vomiting.  You develop abdominal pain.  You feel faint. Document Released: 11/04/2005 Document Revised: 05/05/2012 Document Reviewed: 03/08/2014 [facility]Exit[facility]  Patient Information 2015 [place], [place]. This information is not intended to replace advice given to you by your health care provider. Make sure you discuss any questions you have with your health care provider.

## 2015-03-31 NOTE — Progress Notes (Signed)
   Subjective:    Patient ID: Christopher Krause, male    DOB: 02/20/1976, 39 y.o.   MRN: [id number]  HPI  Patient presents with multiple problems.   1) Left ankle pain for the last 2-3 months. He endorses that every day, his ankle swells up and is painful. He rests it and the swelling goes away. Over the last month the pain has been getting worse. He endorses that he knows that this is in part because of his weight.   2) Lower back pain for approx. 2 months. He feels "pulling" in the middle of his back when he raises his legs or while he is doing certain activities at work. He has tried using Aleve and Flexeril without relief. Occasionally has some " nerve pain down the outside of my right leg."  Review of Systems  Constitutional: Negative for activity change and fatigue.  Musculoskeletal: Positive for back pain and joint swelling (left ankle). Negative for gait problem, neck pain and neck stiffness.  All other systems reviewed and are negative.      Objective:   Physical Exam  Constitutional: He is oriented to person, place, and time. He appears well-developed and well-nourished. No distress.  severely obese  Cardiovascular: Normal rate, regular rhythm and normal heart sounds.  Exam reveals no gallop and no friction rub.   No murmur heard. Pulmonary/Chest: Effort normal and breath sounds normal. No respiratory distress. He has no wheezes. He has no rales. He exhibits no tenderness.  Musculoskeletal: He exhibits edema and tenderness.  Has swelling to left ankle around calcaneous with tenderness to palpation. No redness or warmth   Has pain with palpation to right lower back. No swelling, redness, warmth or mass felt.   Neurological: He is alert and oriented to person, place, and time.  Skin: Skin is warm and dry. No erythema.  Psychiatric: He has a normal mood and affect. His behavior is normal. Judgment and thought content normal.  Nursing note and vitals reviewed.        Assessment & Plan:  1. Left ankle pain - DG Ankle Complete Left; Future - ketorolac (TORADOL) injection 60 mg; Inject 2 mLs (60 mg total) into the muscle once. - methylPREDNISolone acetate (DEPO-MEDROL) injection 120 mg; Inject 1.5 mLs (120 mg total) into the muscle once. - Will follow up with patient regarding radiology reading - Continue to ice, elevate and wear compression on left ankle.  - Follow up if no improvement over the weekend  2. Bilateral low back pain with right-sided sciatica - methocarbamol (ROBAXIN-750) 750 MG tablet; Take 1 tablet (750 mg total) by mouth every 6 (six) hours as needed for muscle spasms.  Dispense: 30 tablet; Refill: 0 - ketorolac (TORADOL) injection 60 mg; Inject 2 mLs (60 mg total) into the muscle once. - methylPREDNISolone acetate (DEPO-MEDROL) injection 120 mg; Inject 1.5 mLs (120 mg total) into the muscle once. - Follow up if no improvement

## 2015-04-11 ENCOUNTER — Ambulatory Visit (INDEPENDENT_AMBULATORY_CARE_PROVIDER_SITE_OTHER): Payer: BLUE CROSS/BLUE SHIELD | Admitting: Adult Health

## 2015-04-11 ENCOUNTER — Encounter: Payer: Self-pay | Admitting: Adult Health

## 2015-04-11 VITALS — BP 130/80 | HR 70 | Temp 99.0°F | Ht 70.5 in | Wt >= 6400 oz

## 2015-04-11 DIAGNOSIS — R05 Cough: Secondary | ICD-10-CM

## 2015-04-11 DIAGNOSIS — J069 Acute upper respiratory infection, unspecified: Secondary | ICD-10-CM

## 2015-04-11 DIAGNOSIS — R059 Cough, unspecified: Secondary | ICD-10-CM

## 2015-04-11 MED ORDER — HYDROCODONE-HOMATROPINE 5-1.5 MG/5ML PO SYRP: 5.0000 mL | ORAL_SOLUTION | Freq: Three times a day (TID) | ORAL | Status: DC | PRN

## 2015-04-11 NOTE — Patient Instructions (Addendum)
Use the Hycodone for the cough, be careful because it will make you sleepy. Add Mucinex throughout the day to help with the congestion. You can also use zyrtec to help with the runny nose.   Use your albuterol inhaler to help open up your lungs.   If you are not feeling any better in 2-3 days then please let me know.   Upper Respiratory Infection, Adult An upper respiratory infection (URI) is also sometimes known as the common cold. The upper respiratory tract includes the nose, sinuses, throat, trachea, and bronchi. Bronchi are the airways leading to the lungs. Most people improve within 1 week, but symptoms can last up to 2 weeks. A residual cough may last even longer.  CAUSES Many different viruses can infect the tissues lining the upper respiratory tract. The tissues become irritated and inflamed and often become very moist. Mucus production is also common. A cold is contagious. You can easily spread the virus to others by oral contact. This includes kissing, sharing a glass, coughing, or sneezing. Touching your mouth or nose and then touching a surface, which is then touched by another person, can also spread the virus. SYMPTOMS  Symptoms typically develop 1 to 3 days after you come in contact with a cold virus. Symptoms vary from person to person. They may include:  Runny nose.  Sneezing.  Nasal congestion.  Sinus irritation.  Sore throat.  Loss of voice (laryngitis).  Cough.  Fatigue.  Muscle aches.  Loss of appetite.  Headache.  Low-grade fever. DIAGNOSIS  You might diagnose your own cold based on familiar symptoms, since most people get a cold 2 to 3 times a year. Your caregiver can confirm this based on your exam. Most importantly, your caregiver can check that your symptoms are not due to another disease such as strep throat, sinusitis, pneumonia, asthma, or epiglottitis. Blood tests, throat tests, and X-rays are not necessary to diagnose a common cold, but they may  sometimes be helpful in excluding other more serious diseases. Your caregiver will decide if any further tests are required. RISKS AND COMPLICATIONS  You may be at risk for a more severe case of the common cold if you smoke cigarettes, have chronic heart disease (such as heart failure) or lung disease (such as asthma), or if you have a weakened immune system. The very young and very old are also at risk for more serious infections. Bacterial sinusitis, middle ear infections, and bacterial pneumonia can complicate the common cold. The common cold can worsen asthma and chronic obstructive pulmonary disease (COPD). Sometimes, these complications can require emergency medical care and may be life-threatening. PREVENTION  The best way to protect against getting a cold is to practice good hygiene. Avoid oral or hand contact with people with cold symptoms. Wash your hands often if contact occurs. There is no clear evidence that vitamin C, vitamin E, echinacea, or exercise reduces the chance of developing a cold. However, it is always recommended to get plenty of rest and practice good nutrition. TREATMENT  Treatment is directed at relieving symptoms. There is no cure. Antibiotics are not effective, because the infection is caused by a virus, not by bacteria. Treatment may include:  Increased fluid intake. Sports drinks offer valuable electrolytes, sugars, and fluids.  Breathing heated mist or steam (vaporizer or shower).  Eating chicken soup or other clear broths, and maintaining good nutrition.  Getting plenty of rest.  Using gargles or lozenges for comfort.  Controlling fevers with ibuprofen or acetaminophen  as directed by your caregiver.  Increasing usage of your inhaler if you have asthma. Zinc gel and zinc lozenges, taken in the first 24 hours of the common cold, can shorten the duration and lessen the severity of symptoms. Pain medicines may help with fever, muscle aches, and throat pain. A  variety of non-prescription medicines are available to treat congestion and runny nose. Your caregiver can make recommendations and may suggest nasal or lung inhalers for other symptoms.  HOME CARE INSTRUCTIONS   Only take over-the-counter or prescription medicines for pain, discomfort, or fever as directed by your caregiver.  Use a warm mist humidifier or inhale steam from a shower to increase air moisture. This may keep secretions moist and make it easier to breathe.  Drink enough water and fluids to keep your urine clear or pale yellow.  Rest as needed.  Return to work when your temperature has returned to normal or as your caregiver advises. You may need to stay home longer to avoid infecting others. You can also use a face mask and careful hand washing to prevent spread of the virus. SEEK MEDICAL CARE IF:   After the first few days, you feel you are getting worse rather than better.  You need your caregiver's advice about medicines to control symptoms.  You develop chills, worsening shortness of breath, or brown or red sputum. These may be signs of pneumonia.  You develop yellow or brown nasal discharge or pain in the face, especially when you bend forward. These may be signs of sinusitis.  You develop a fever, swollen neck glands, pain with swallowing, or white areas in the back of your throat. These may be signs of strep throat. SEEK IMMEDIATE MEDICAL CARE IF:   You have a fever.  You develop severe or persistent headache, ear pain, sinus pain, or chest pain.  You develop wheezing, a prolonged cough, cough up blood, or have a change in your usual mucus (if you have chronic lung disease).  You develop sore muscles or a stiff neck. Document Released: 04/30/2001 Document Revised: 01/27/2012 Document Reviewed: 02/09/2014 [facility] Patient Information 2015 [place], [place]. This information is not intended to replace advice given to you by your health care provider. Make sure you  discuss any questions you have with your health care provider.

## 2015-04-11 NOTE — Progress Notes (Signed)
Pre visit review using our clinic review tool, if applicable. No additional management support is needed unless otherwise documented below in the visit note. 

## 2015-04-11 NOTE — Progress Notes (Signed)
   Subjective:    Patient ID: Christopher Krause, male    DOB: 04/30/1976, 39 y.o.   MRN: [id number]  HPI  Patient presents to the office today for  Productive cough, congestion, fever up to 101, blood in nose, mild headache, fatigue, loss of appetite, and a rattle in his chest since Saturday. He feels as though " it has peaked today."   Review of Systems  Constitutional: Positive for fever, activity change, appetite change and fatigue. Negative for chills, diaphoresis and unexpected weight change.  HENT: Positive for congestion, nosebleeds, postnasal drip, rhinorrhea and sinus pressure. Negative for ear discharge, ear pain and sore throat.   Eyes: Negative for pain and itching.  Respiratory: Positive for cough, shortness of breath and wheezing.   Cardiovascular: Negative for chest pain, palpitations and leg swelling.  Neurological: Positive for dizziness and headaches. Negative for light-headedness.  All other systems reviewed and are negative.      Objective:   Physical Exam  Constitutional: He is oriented to person, place, and time. He appears well-developed and well-nourished. No distress.  Obese   HENT:  Head: Normocephalic and atraumatic.  Right Ear: External ear normal.  Left Ear: External ear normal.  Nose: Nose normal.  Mouth/Throat: Oropharynx is clear and moist. No oropharyngeal exudate.  Redness and irritation in nasal passageway, no blood noted.   TM visualized, no acute inflammation noted.   Eyes: Conjunctivae are normal. Pupils are equal, round, and reactive to light. Right eye exhibits no discharge. Left eye exhibits no discharge.  Cardiovascular: Normal rate, regular rhythm and normal heart sounds.  Exam reveals no gallop and no friction rub.   No murmur heard. Pulmonary/Chest: Effort normal. He has wheezes (Right upper). He has no rales. He exhibits no tenderness.  Lymphadenopathy:    He has no cervical adenopathy.  Neurological: He is alert and oriented to  person, place, and time.  Skin: Skin is warm and dry. No rash noted. He is not diaphoretic. No erythema. No pallor.  Psychiatric: He has a normal mood and affect. His behavior is normal. Judgment and thought content normal.  Nursing note and vitals reviewed.      Assessment & Plan:  1. Acute upper respiratory infection - Use Albuterol Inhaler 2-3 puffs TID PRN - Zyrtec and Saline nasal spray - Mucinex as directed - Follow up if no improvement in 2-3 days.   2. Cough - HYDROcodone-homatropine (HYCODAN) 5-1.5 MG/5ML syrup; Take 5 mLs by mouth every 8 (eight) hours as needed for cough.  Dispense: 120 mL; Refill: 0

## 2015-04-12 NOTE — Addendum Note (Signed)
Addended by: Nancy FetterNAFZIGER, Suzanne Kho L on: 04/12/2015 07:54 AM   Modules accepted: Level of Service

## 2015-04-13 ENCOUNTER — Telehealth: Payer: Self-pay | Admitting: Family Medicine

## 2015-04-13 ENCOUNTER — Ambulatory Visit (HOSPITAL_COMMUNITY)
Admission: RE | Admit: 2015-04-13 | Discharge: 2015-04-13 | Disposition: A | Discharge status: Home / Self Care | Payer: BLUE CROSS/BLUE SHIELD | Source: Ambulatory Visit | Attending: Adult Health | Admitting: Adult Health

## 2015-04-13 ENCOUNTER — Other Ambulatory Visit: Payer: Self-pay | Admitting: Adult Health

## 2015-04-13 DIAGNOSIS — R0989 Other specified symptoms and signs involving the circulatory and respiratory systems: Secondary | ICD-10-CM | POA: Insufficient documentation

## 2015-04-13 DIAGNOSIS — J209 Acute bronchitis, unspecified: Secondary | ICD-10-CM

## 2015-04-13 DIAGNOSIS — R05 Cough: Secondary | ICD-10-CM | POA: Diagnosis present

## 2015-04-13 DIAGNOSIS — R059 Cough, unspecified: Secondary | ICD-10-CM

## 2015-04-13 DIAGNOSIS — R509 Fever, unspecified: Secondary | ICD-10-CM | POA: Insufficient documentation

## 2015-04-13 MED ORDER — DOXYCYCLINE HYCLATE 50 MG PO CAPS: 50.0000 mg | ORAL_CAPSULE | Freq: Two times a day (BID) | ORAL | Status: DC

## 2015-04-13 NOTE — Telephone Encounter (Signed)
Ok per [place]ory to write note; note is ready for pick up. Pt is aware letter is ready for pick up.

## 2015-04-13 NOTE — Telephone Encounter (Signed)
Pt call said his work note need to say he will return to work on 04/18/15

## 2015-04-13 NOTE — Telephone Encounter (Signed)
Called and spoke with pt and pt states he is still sluggish, and wheezing but the congestion is better.  Pt denies fever. Pt is still coughing up mucus that is a green color. Pt denies sinus pressure, headaches or tooth pain. Per Kandee Ke[facility]ory [facility] Pt verbalized understanding.  Do you want to extend pt's leave until 5.30.2016?

## 2015-04-13 NOTE — Telephone Encounter (Signed)
Patient states he saw [place] on 04/11/15 for a viral infection and he says he is still not feeling well.  He would like to know if Kandee KeenCory can extend his "out of work" note an extra day?

## 2015-05-30 IMAGING — CR DG CHEST 2V
2 series · 2 of 2 positions shown · non-contrast
Comparison: August 03, 2012.

CLINICAL DATA: Cough, fever, chest congestion.

EXAM:
CHEST  2 VIEW

[w chest pa *]
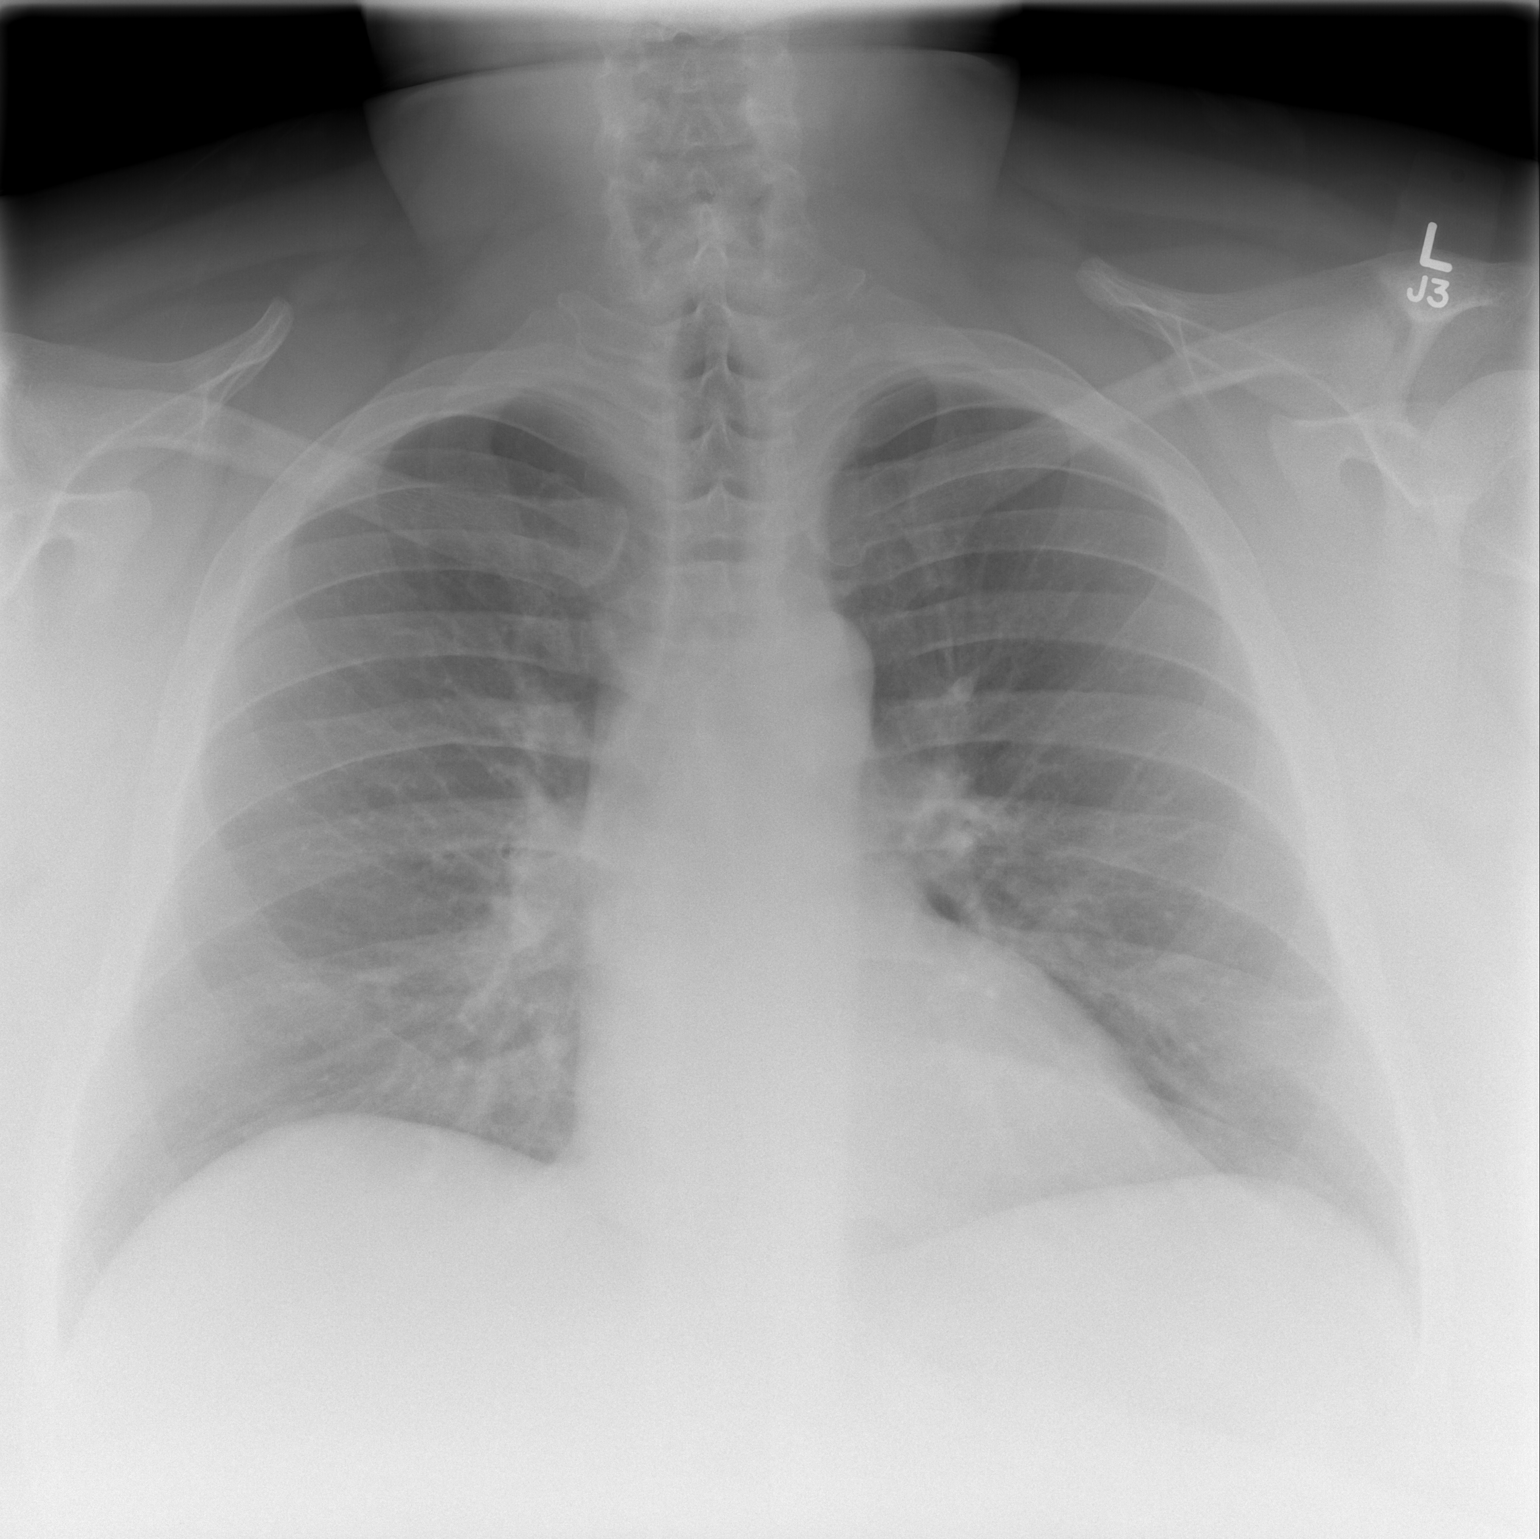

[w chest lat *]
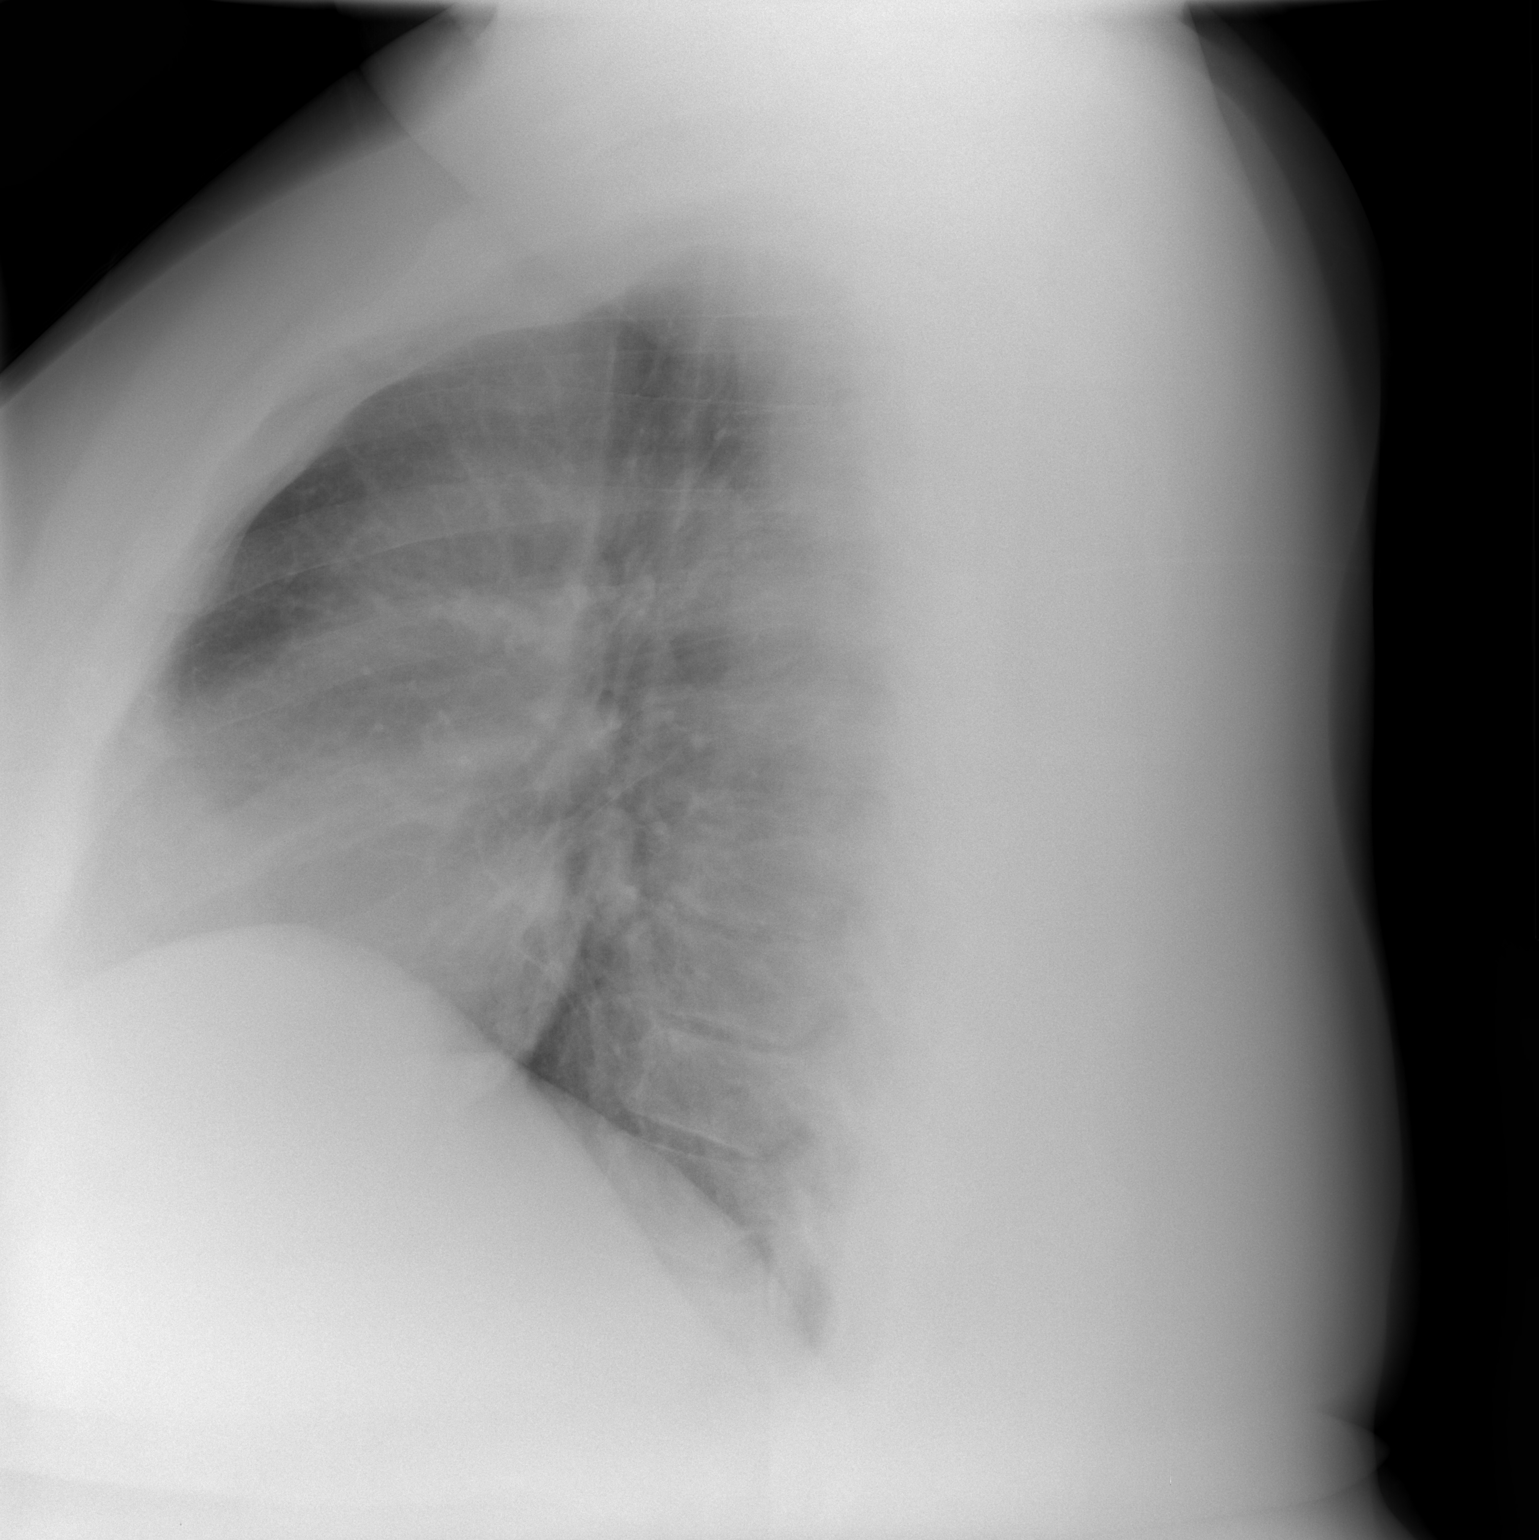

[2 of 2 positions shown; findings below may reference images not displayed]

FINDINGS: The heart size and mediastinal contours are within normal limits.
Both lungs are clear. No pneumothorax or pleural effusion is noted.
The visualized skeletal structures are unremarkable.
IMPRESSION: No active cardiopulmonary disease.

## 2015-11-24 ENCOUNTER — Ambulatory Visit (INDEPENDENT_AMBULATORY_CARE_PROVIDER_SITE_OTHER): Payer: BLUE CROSS/BLUE SHIELD | Admitting: Family Medicine

## 2015-11-24 VITALS — BP 131/85 | HR 78 | Temp 98.2°F | Resp 18 | Ht 70.5 in

## 2015-11-24 DIAGNOSIS — M5441 Lumbago with sciatica, right side: Secondary | ICD-10-CM

## 2015-11-24 MED ORDER — PREDNISONE 20 MG PO TABS: ORAL_TABLET | ORAL | Status: DC

## 2015-11-24 MED ORDER — DICLOFENAC SODIUM 75 MG PO TBEC: 75.0000 mg | DELAYED_RELEASE_TABLET | Freq: Two times a day (BID) | ORAL | Status: DC

## 2015-11-24 NOTE — Patient Instructions (Signed)
Take prednisone 20 mg 3 pills daily for 2 days, then 2 daily for 2 days, then 1 daily for 2 days. Take after breakfast  Take the diclofenac 75 mg one twice daily for pain and inflammation and back. Do not take any ibuprofen or Aleve while you're on this as they are in the same class.   You can take some Tylenol in addition to this if necessary, 500 mg 2 tablets 3 times daily. (Acetaminophen)  Return if not improving  Think seriously about the weight loss as we discussed:  Get rid of liquid calories  Get rid of snacks, eat fruit if necessary  Decrease starches at meal times, as well as fried and fatty foods, and try to fill up on lean meats, fruits and vegetables. Cut back on quantities  Desserts for special occasions only  B extremely cautious if eating out  Get regular exercise as discussed

## 2015-11-24 NOTE — Progress Notes (Signed)
Patient ID: Christopher Krause, male    DOB: 1976-01-03  Age: 40 y.o. MRN: [id number]  Chief Complaint  Patient presents with  . Back Pain    Lower and around to right side onset 3-4 days  . Tingling    Big right toe onset 2 days    Subjective:   39 year old man who is here with low back pain. It's been bothering him the last few days, then he started getting numbness in his right large toe and that got more concerned. He knows that he has a major weight problem, and that stresses his back. He is been told in the past that he had narrowing of disc in his back. He did not have any major specific injury this time. He does work daily at a machine. He has gotten together with one of his bosses who is going to be working out with them regularly at a gym.  Current allergies, medications, problem list, past/family and social histories reviewed.  Objective:  BP 131/85 mmHg  Pulse 78  Temp(Src) 98.2 F (36.8 C) (Oral)  Resp 18  Ht 5' 10.5" (1.791 m)  SpO2 98%  Morbidly obese 40 year old man in no major acute distress. He is tender in the low back at about the lumbosacral region, especially to the right of the midline in the right SI joint region. He has right large toe has mildly decreased sensation but he still can feel things there. Pulses are good.  Assessment & Plan:   Assessment: 1. Right-sided low back pain with right-sided sciatica   2. Morbid obesity, unspecified obesity type (HCC)       Plan: Prednisone taper and diclofenac. Return if not improving. Had a long talk with him about his weight and the major need for him to make some lifestyle changes and take responsibility for this. It is not something anyone else can do for him short of having major surgery. I hope he will work on doing this on his own in a serious fashion as we discussed.  No orders of the defined types were placed in this encounter.    Meds ordered this encounter  Medications  . etodolac (LODINE) 200 MG  capsule    Sig: Take 200 mg by mouth 2 (two) times daily.  . diclofenac (VOLTAREN) 75 MG EC tablet    Sig: Take 1 tablet (75 mg total) by mouth 2 (two) times daily.    Dispense:  30 tablet    Refill:  0  . predniSONE (DELTASONE) 20 MG tablet    Sig: Take 3 daily for 2 days, then 2 daily for 2 days, then 1 daily for 2 days. Best taken after breakfast.    Dispense:  12 tablet    Refill:  0         Patient Instructions  Take prednisone 20 mg 3 pills daily for 2 days, then 2 daily for 2 days, then 1 daily for 2 days. Take after breakfast  Take the diclofenac 75 mg one twice daily for pain and inflammation and back. Do not take any ibuprofen or Aleve while you're on this as they are in the same class.   You can take some Tylenol in addition to this if necessary, 500 mg 2 tablets 3 times daily. (Acetaminophen)  Return if not improving  Think seriously about the weight loss as we discussed:  Get rid of liquid calories  Get rid of snacks, eat fruit if necessary  Decrease starches at meal times,  as well as fried and fatty foods, and try to fill up on lean meats, fruits and vegetables. Cut back on quantities  Desserts for special occasions only  B extremely cautious if eating out  Get regular exercise as discussed     Return if symptoms worsen or fail to improve.   Tiwana Chavis, MD 11/24/2015

## 2016-01-22 ENCOUNTER — Encounter: Payer: Self-pay | Admitting: *Deleted

## 2016-01-22 ENCOUNTER — Ambulatory Visit (INDEPENDENT_AMBULATORY_CARE_PROVIDER_SITE_OTHER): Payer: BLUE CROSS/BLUE SHIELD | Admitting: Family Medicine

## 2016-01-22 ENCOUNTER — Encounter: Payer: Self-pay | Admitting: Family Medicine

## 2016-01-22 VITALS — BP 138/88 | HR 101 | Temp 98.6°F | Ht 70.5 in | Wt >= 6400 oz

## 2016-01-22 DIAGNOSIS — J069 Acute upper respiratory infection, unspecified: Secondary | ICD-10-CM | POA: Diagnosis not present
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
## 2016-01-22 NOTE — Progress Notes (Signed)
Pre visit review using our clinic review tool, if applicable. No additional management support is needed unless otherwise documented below in the visit note. 

## 2016-01-22 NOTE — Patient Instructions (Signed)
BEFORE YOU LEAVE: -work note  INSTRUCTIONS FOR UPPER RESPIRATORY INFECTION:  -plenty of rest and fluids  -nasal saline wash 2-3 times daily (use prepackaged nasal saline or bottled/distilled water if making your own)   -can use AFRIN nasal spray for drainage and nasal congestion - but do NOT use longer then 3-4 days  -can use tylenol (in no history of liver disease) or ibuprofen (if no history of kidney disease, bowel bleeding or significant heart disease) as directed for aches and sorethroat  -in the winter time, using a humidifier at night is helpful (please follow cleaning instructions)  -if you are taking a cough medication - use only as directed, may also try a teaspoon of honey to coat the throat and throat lozenges. If given a cough medication with codeine or hydrocodone or other narcotic please be advised that this contains a strong and  potentially addicting medication. Please follow instructions carefully, take as little as possible and only use AS NEEDED for severe cough. Discuss potential side effects with your pharmacy. Please do not drive or operate machinery while taking these types of medications. Please do not take other sedating medications, drugs or alcohol while taking this medication without discussing with your doctor.  -for sore throat, salt water gargles can help  -follow up if you have fevers, facial pain, tooth pain, difficulty breathing or are worsening or symptoms persist longer then expected  Upper Respiratory Infection, Adult An upper respiratory infection (URI) is also known as the common cold. It is often caused by a type of germ (virus). Colds are easily spread (contagious). You can pass it to others by kissing, coughing, sneezing, or drinking out of the same glass. Usually, you get better in 1 to 3  weeks.  However, the cough can last for even longer. HOME CARE   Only take medicine as told by your doctor. Follow instructions provided above.  Drink enough  water and fluids to keep your pee (urine) clear or pale yellow.  Get plenty of rest.  Return to work when your temperature is < 100 for 24 hours or as told by your doctor. You may use a face mask and wash your hands to stop your cold from spreading. GET HELP RIGHT AWAY IF:   After the first few days, you feel you are getting worse.  You have questions about your medicine.  You have chills, shortness of breath, or red spit (mucus).  You have pain in the face for more then 1-2 days, especially when you bend forward.  You have a fever, puffy (swollen) neck, pain when you swallow, or white spots in the back of your throat.  You have a bad headache, ear pain, sinus pain, or chest pain.  You have a high-pitched whistling sound when you breathe in and out (wheezing).  You cough up blood.  You have sore muscles or a stiff neck. MAKE SURE YOU:   Understand these instructions.  Will watch your condition.  Will get help right away if you are not doing well or get worse. Document Released: 04/22/2008 Document Revised: 01/27/2012 Document Reviewed: 02/09/2014 [facility] Patient Information 2015 [place]. This information is not intended to replace advice given to you by your health care provider. Make sure you discuss any questions you have with your health care provider.

## 2016-01-23 ENCOUNTER — Ambulatory Visit (INDEPENDENT_AMBULATORY_CARE_PROVIDER_SITE_OTHER): Payer: BLUE CROSS/BLUE SHIELD | Admitting: Family Medicine

## 2016-01-23 VITALS — BP 147/99 | HR 84 | Temp 97.9°F | Resp 12 | Ht 70.0 in

## 2016-01-23 DIAGNOSIS — J302 Other seasonal allergic rhinitis: Secondary | ICD-10-CM

## 2016-01-23 DIAGNOSIS — J029 Acute pharyngitis, unspecified: Secondary | ICD-10-CM | POA: Diagnosis not present

## 2016-01-23 LAB — POCT RAPID STREP A (OFFICE): Rapid Strep A Screen: NEGATIVE

## 2016-01-23 MED ORDER — FLUTICASONE PROPIONATE 50 MCG/ACT NA SUSP: 2.0000 | Freq: Every day | NASAL | Status: AC

## 2016-01-23 NOTE — Patient Instructions (Addendum)
Because you received labwork today, you will receive an invoice from [facility]. Please contact [facility] at [phone] with questions or concerns regarding your invoice. Our billing staff will not be able to assist you with those questions.  You will be contacted with the lab results as soon as they are available. The fastest way to get your results is to activate your My Chart account. Instructions are located on the last page of this paperwork. If you have not heard from us regarding the results in 2 weeks, please contact this office.   You do not have strep throat You have a viral illness and seasonal allergies Please start your Zyrtec and I have sent Flonase nasal spray to your pharmacy Increase your fluids so your urine is light yellow You can take ibuprofen 3 tablets every 8-12 hours for pain
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
## 2016-01-25 LAB — CULTURE, GROUP A STREP

## 2016-01-25 MED ORDER — AMOXICILLIN 500 MG PO CAPS: 500.0000 mg | ORAL_CAPSULE | Freq: Two times a day (BID) | ORAL | Status: DC

## 2016-05-24 DIAGNOSIS — H5203 Hypermetropia, bilateral: Secondary | ICD-10-CM | POA: Diagnosis not present

## 2016-12-16 ENCOUNTER — Ambulatory Visit (INDEPENDENT_AMBULATORY_CARE_PROVIDER_SITE_OTHER): Payer: BLUE CROSS/BLUE SHIELD | Admitting: Physician Assistant

## 2016-12-25 ENCOUNTER — Ambulatory Visit (INDEPENDENT_AMBULATORY_CARE_PROVIDER_SITE_OTHER): Payer: BLUE CROSS/BLUE SHIELD | Admitting: Physician Assistant

## 2016-12-25 ENCOUNTER — Ambulatory Visit (INDEPENDENT_AMBULATORY_CARE_PROVIDER_SITE_OTHER): Payer: Self-pay

## 2016-12-25 ENCOUNTER — Encounter (INDEPENDENT_AMBULATORY_CARE_PROVIDER_SITE_OTHER): Payer: Self-pay

## 2016-12-25 DIAGNOSIS — M25562 Pain in left knee: Secondary | ICD-10-CM | POA: Diagnosis not present

## 2016-12-25 DIAGNOSIS — M545 Low back pain: Secondary | ICD-10-CM | POA: Diagnosis not present

## 2016-12-25 MED ORDER — ETODOLAC 200 MG PO CAPS: 200.0000 mg | ORAL_CAPSULE | Freq: Two times a day (BID) | ORAL | 1 refills | Status: DC
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
## 2016-12-27 ENCOUNTER — Telehealth (INDEPENDENT_AMBULATORY_CARE_PROVIDER_SITE_OTHER): Payer: Self-pay | Admitting: Physician Assistant

## 2016-12-27 NOTE — Telephone Encounter (Signed)
Pt asking if we can extend his oow note until Monday.  Pt can pick up  [phone]

## 2016-12-27 NOTE — Telephone Encounter (Signed)
That is fine 

## 2016-12-27 NOTE — Telephone Encounter (Signed)
Please advise 

## 2016-12-30 ENCOUNTER — Encounter (INDEPENDENT_AMBULATORY_CARE_PROVIDER_SITE_OTHER): Payer: Self-pay

## 2016-12-30 NOTE — Telephone Encounter (Signed)
LMOM for patient letting him know that I wrote him the note and he can swing by and get it

## 2017-01-01 ENCOUNTER — Ambulatory Visit (INDEPENDENT_AMBULATORY_CARE_PROVIDER_SITE_OTHER): Payer: BLUE CROSS/BLUE SHIELD | Admitting: Physician Assistant

## 2017-01-01 ENCOUNTER — Encounter (INDEPENDENT_AMBULATORY_CARE_PROVIDER_SITE_OTHER): Payer: Self-pay | Admitting: Physician Assistant

## 2017-01-01 DIAGNOSIS — M5442 Lumbago with sciatica, left side: Secondary | ICD-10-CM

## 2017-01-01 MED ORDER — METHYLPREDNISOLONE 4 MG PO TABS: ORAL_TABLET | ORAL | 0 refills | Status: DC

## 2017-01-01 MED ORDER — METHOCARBAMOL 500 MG PO TABS: 500.0000 mg | ORAL_TABLET | Freq: Three times a day (TID) | ORAL | 1 refills | Status: DC
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
## 2017-01-03 ENCOUNTER — Telehealth (INDEPENDENT_AMBULATORY_CARE_PROVIDER_SITE_OTHER): Payer: Self-pay | Admitting: *Deleted

## 2017-01-03 ENCOUNTER — Encounter (INDEPENDENT_AMBULATORY_CARE_PROVIDER_SITE_OTHER): Payer: Self-pay

## 2017-01-03 NOTE — Telephone Encounter (Signed)
Patient aware note is at the front desk for him

## 2017-01-03 NOTE — Telephone Encounter (Signed)
Patient called in this morning in regards to needing a new work note please. He needs it to state the dates that have been approved for him to be out of work? His CB # [phone]. Thank you

## 2017-01-03 NOTE — Telephone Encounter (Signed)
See below. What are those days?

## 2017-01-03 NOTE — Telephone Encounter (Signed)
Please call him and place him out whatever days he needs.

## 2017-01-21 ENCOUNTER — Ambulatory Visit (INDEPENDENT_AMBULATORY_CARE_PROVIDER_SITE_OTHER): Payer: BLUE CROSS/BLUE SHIELD | Admitting: Orthopaedic Surgery

## 2017-01-22 ENCOUNTER — Encounter: Payer: Self-pay | Admitting: Adult Health

## 2017-01-22 ENCOUNTER — Ambulatory Visit (INDEPENDENT_AMBULATORY_CARE_PROVIDER_SITE_OTHER): Payer: BLUE CROSS/BLUE SHIELD | Admitting: Adult Health

## 2017-01-22 VITALS — BP 128/78 | HR 68 | Temp 98.5°F | Ht 70.0 in | Wt >= 6400 oz

## 2017-01-22 DIAGNOSIS — M545 Low back pain, unspecified: Secondary | ICD-10-CM
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
## 2017-01-29 ENCOUNTER — Ambulatory Visit (INDEPENDENT_AMBULATORY_CARE_PROVIDER_SITE_OTHER): Payer: BLUE CROSS/BLUE SHIELD | Admitting: Physician Assistant

## 2017-05-13 DIAGNOSIS — Z6841 Body Mass Index (BMI) 40.0 and over, adult: Secondary | ICD-10-CM | POA: Diagnosis not present

## 2017-05-13 DIAGNOSIS — J329 Chronic sinusitis, unspecified: Secondary | ICD-10-CM | POA: Diagnosis not present

## 2017-12-03 DIAGNOSIS — H6692 Otitis media, unspecified, left ear: Secondary | ICD-10-CM | POA: Diagnosis not present

## 2017-12-03 DIAGNOSIS — Z6841 Body Mass Index (BMI) 40.0 and over, adult: Secondary | ICD-10-CM | POA: Diagnosis not present

## 2017-12-09 ENCOUNTER — Ambulatory Visit: Payer: BLUE CROSS/BLUE SHIELD | Admitting: Adult Health

## 2017-12-09 ENCOUNTER — Encounter: Payer: Self-pay | Admitting: Adult Health

## 2017-12-09 VITALS — BP 150/100 | HR 89 | Temp 98.2°F

## 2017-12-09 DIAGNOSIS — J018 Other acute sinusitis: Secondary | ICD-10-CM
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
## 2018-08-04 DIAGNOSIS — Z9109 Other allergy status, other than to drugs and biological substances: Secondary | ICD-10-CM | POA: Insufficient documentation

## 2018-10-20 DIAGNOSIS — J0100 Acute maxillary sinusitis, unspecified: Secondary | ICD-10-CM | POA: Diagnosis not present

## 2018-10-20 DIAGNOSIS — R05 Cough: Secondary | ICD-10-CM | POA: Diagnosis not present

## 2018-10-20 DIAGNOSIS — K219 Gastro-esophageal reflux disease without esophagitis: Secondary | ICD-10-CM | POA: Insufficient documentation

## 2018-12-31 ENCOUNTER — Other Ambulatory Visit (HOSPITAL_COMMUNITY)
Admission: RE | Admit: 2018-12-31 | Discharge: 2018-12-31 | Disposition: A | Discharge status: Home / Self Care | Payer: BLUE CROSS/BLUE SHIELD | Source: Ambulatory Visit | Attending: Internal Medicine | Admitting: Internal Medicine

## 2018-12-31 ENCOUNTER — Ambulatory Visit: Payer: BLUE CROSS/BLUE SHIELD | Admitting: Internal Medicine

## 2018-12-31 ENCOUNTER — Encounter: Payer: Self-pay | Admitting: Internal Medicine

## 2018-12-31 ENCOUNTER — Other Ambulatory Visit: Payer: Self-pay | Admitting: Internal Medicine

## 2018-12-31 VITALS — BP 110/80 | HR 70 | Temp 97.9°F | Ht 71.0 in | Wt >= 6400 oz

## 2018-12-31 DIAGNOSIS — E559 Vitamin D deficiency, unspecified: Secondary | ICD-10-CM | POA: Insufficient documentation

## 2018-12-31 DIAGNOSIS — I10 Essential (primary) hypertension: Secondary | ICD-10-CM | POA: Diagnosis not present

## 2018-12-31 DIAGNOSIS — Z23 Encounter for immunization: Secondary | ICD-10-CM

## 2018-12-31 DIAGNOSIS — K2100 Gastro-esophageal reflux disease with esophagitis, without bleeding: Secondary | ICD-10-CM

## 2018-12-31 DIAGNOSIS — Z113 Encounter for screening for infections with a predominantly sexual mode of transmission: Secondary | ICD-10-CM | POA: Diagnosis not present

## 2018-12-31 DIAGNOSIS — Z0000 Encounter for general adult medical examination without abnormal findings: Secondary | ICD-10-CM

## 2018-12-31 LAB — CBC WITH DIFFERENTIAL/PLATELET
Basophils Absolute: 0.1 10*3/uL (ref 0.0–0.1)
Basophils Relative: 1.3 % (ref 0.0–3.0)
Eosinophils Absolute: 0.1 10*3/uL (ref 0.0–0.7)
Eosinophils Relative: 1.6 % (ref 0.0–5.0)
HCT: 44.2 % (ref 39.0–52.0)
HEMOGLOBIN: 14.7 g/dL (ref 13.0–17.0)
Lymphocytes Relative: 25.8 % (ref 12.0–46.0)
Lymphs Abs: 2.2 10*3/uL (ref 0.7–4.0)
MCHC: 33.2 g/dL (ref 30.0–36.0)
MCV: 95.4 fl (ref 78.0–100.0)
MONOS PCT: 7.5 % (ref 3.0–12.0)
Monocytes Absolute: 0.6 10*3/uL (ref 0.1–1.0)
Neutro Abs: 5.3 10*3/uL (ref 1.4–7.7)
Neutrophils Relative %: 63.8 % (ref 43.0–77.0)
Platelets: 240 10*3/uL (ref 150.0–400.0)
RBC: 4.63 Mil/uL (ref 4.22–5.81)
RDW: 13.4 % (ref 11.5–15.5)
WBC: 8.4 10*3/uL (ref 4.0–10.5)

## 2018-12-31 LAB — COMPREHENSIVE METABOLIC PANEL
ALT: 12 U/L (ref 0–53)
AST: 12 U/L (ref 0–37)
Albumin: 3.9 g/dL (ref 3.5–5.2)
Alkaline Phosphatase: 79 U/L (ref 39–117)
BUN: 12 mg/dL (ref 6–23)
CO2: 31 mEq/L (ref 19–32)
Calcium: 9.3 mg/dL (ref 8.4–10.5)
Chloride: 103 mEq/L (ref 96–112)
Creatinine, Ser: 0.9 mg/dL (ref 0.40–1.50)
GFR: 111.78 mL/min (ref >60.00)
Glucose, Bld: 88 mg/dL (ref 70–99)
Potassium: 4.3 mEq/L (ref 3.5–5.1)
Sodium: 140 mEq/L (ref 135–145)
Total Bilirubin: 0.6 mg/dL (ref 0.2–1.2)
Total Protein: 6.5 g/dL (ref 6.0–8.3)

## 2018-12-31 LAB — LIPID PANEL
CHOL/HDL RATIO: 2
Cholesterol: 165 mg/dL (ref 0–200)
HDL: 72.9 mg/dL (ref >39.00)
LDL CALC: 73 mg/dL (ref 0–99)
NonHDL: 92.01
Triglycerides: 95 mg/dL (ref 0.0–149.0)
VLDL: 19 mg/dL (ref 0.0–40.0)

## 2018-12-31 LAB — VITAMIN D 25 HYDROXY (VIT D DEFICIENCY, FRACTURES): VITD: 24.7 ng/mL — ABNORMAL LOW (ref 30.00–100.00)

## 2018-12-31 LAB — TSH: TSH: 4.47 u[IU]/mL (ref 0.35–4.50)

## 2018-12-31 LAB — HEMOGLOBIN A1C: Hgb A1c MFr Bld: 5.2 % (ref 4.6–6.5)

## 2018-12-31 MED ORDER — VITAMIN D (ERGOCALCIFEROL) 1.25 MG (50000 UNIT) PO CAPS: 50000.0000 [IU] | ORAL_CAPSULE | ORAL | 0 refills | Status: DC
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
## 2018-12-31 NOTE — Addendum Note (Signed)
Addended by: Bonnye Fava on: 12/31/2018 09:49 AM   Modules accepted: Orders

## 2018-12-31 NOTE — Patient Instructions (Signed)
-Nice meeting you today!  -Flu vaccine today.  -Lab work, will notify you when results are available.  -Schedule follow up in 6 months (30 min visit)   Preventive Care 43-64 Years, Male Preventive care refers to lifestyle choices and visits with your health care provider that can promote health and wellness. What does preventive care include?   A yearly physical exam. This is also called an annual well check.  Dental exams once or twice a year.  Routine eye exams. Ask your health care provider how often you should have your eyes checked.  Personal lifestyle choices, including: ? Daily care of your teeth and gums. ? Regular physical activity. ? Eating a healthy diet. ? Avoiding tobacco and drug use. ? Limiting alcohol use. ? Practicing safe sex. ? Taking low-dose aspirin every day starting at age 43. What happens during an annual well check? The services and screenings done by your health care provider during your annual well check will depend on your age, overall health, lifestyle risk factors, and family history of disease. Counseling Your health care provider may ask you questions about your:  Alcohol use.  Tobacco use.  Drug use.  Emotional well-being.  Home and relationship well-being.  Sexual activity.  Eating habits.  Work and work Statistician. Screening You may have the following tests or measurements:  Height, weight, and BMI.  Blood pressure.  Lipid and cholesterol levels. These may be checked every 5 years, or more frequently if you are over 27 years old.  Skin check.  Lung cancer screening. You may have this screening every year starting at age 43 if you have a 30-pack-year history of smoking and currently smoke or have quit within the past 15 years.  Colorectal cancer screening. All adults should have this screening starting at age 43 and continuing until age 22. Your health care provider may recommend screening at age 43. You will have tests  every 1-10 years, depending on your results and the type of screening test. People at increased risk should start screening at an earlier age. Screening tests may include: ? Guaiac-based fecal occult blood testing. ? Fecal immunochemical test (FIT). ? Stool DNA test. ? Virtual colonoscopy. ? Sigmoidoscopy. During this test, a flexible tube with a tiny camera (sigmoidoscope) is used to examine your rectum and lower colon. The sigmoidoscope is inserted through your anus into your rectum and lower colon. ? Colonoscopy. During this test, a long, thin, flexible tube with a tiny camera (colonoscope) is used to examine your entire colon and rectum.  Prostate cancer screening. Recommendations will vary depending on your family history and other risks.  Hepatitis C blood test.  Hepatitis B blood test.  Sexually transmitted disease (STD) testing.  Diabetes screening. This is done by checking your blood sugar (glucose) after you have not eaten for a while (fasting). You may have this done every 1-3 years. Discuss your test results, treatment options, and if necessary, the need for more tests with your health care provider. Vaccines Your health care provider may recommend certain vaccines, such as:  Influenza vaccine. This is recommended every year.  Tetanus, diphtheria, and acellular pertussis (Tdap, Td) vaccine. You may need a Td booster every 10 years.  Varicella vaccine. You may need this if you have not been vaccinated.  Zoster vaccine. You may need this after age 43.  Measles, mumps, and rubella (MMR) vaccine. You may need at least one dose of MMR if you were born in 1957 or later. You may  also need a second dose.  Pneumococcal 13-valent conjugate (PCV13) vaccine. You may need this if you have certain conditions and have not been vaccinated.  Pneumococcal polysaccharide (PPSV23) vaccine. You may need one or two doses if you smoke cigarettes or if you have certain  conditions.  Meningococcal vaccine. You may need this if you have certain conditions.  Hepatitis A vaccine. You may need this if you have certain conditions or if you travel or work in places where you may be exposed to hepatitis A.  Hepatitis B vaccine. You may need this if you have certain conditions or if you travel or work in places where you may be exposed to hepatitis B.  Haemophilus influenzae type b (Hib) vaccine. You may need this if you have certain risk factors. Talk to your health care provider about which screenings and vaccines you need and how often you need them. This information is not intended to replace advice given to you by your health care provider. Make sure you discuss any questions you have with your health care provider. Document Released: 12/01/2015 Document Revised: 12/25/2017 Document Reviewed: 09/05/2015 Elsevier Interactive Patient Education  2019 Elsevier Inc.  

## 2019-01-01 ENCOUNTER — Other Ambulatory Visit: Payer: Self-pay | Admitting: Internal Medicine

## 2019-01-01 DIAGNOSIS — E559 Vitamin D deficiency, unspecified: Secondary | ICD-10-CM

## 2019-01-01 LAB — HEPATITIS PANEL, ACUTE
HEP B C IGM: NONREACTIVE
Hep A IgM: NONREACTIVE
Hepatitis B Surface Ag: NONREACTIVE
Hepatitis C Ab: NONREACTIVE
SIGNAL TO CUT-OFF: 0.1 (ref <1.00)

## 2019-01-01 LAB — URINE CYTOLOGY ANCILLARY ONLY
Chlamydia: NEGATIVE
Neisseria Gonorrhea: NEGATIVE
Trichomonas: NEGATIVE

## 2019-01-01 LAB — RPR: RPR Ser Ql: NONREACTIVE

## 2019-01-01 LAB — HIV ANTIBODY (ROUTINE TESTING W REFLEX): HIV 1&2 Ab, 4th Generation: NONREACTIVE

## 2019-01-30 DIAGNOSIS — Z6841 Body Mass Index (BMI) 40.0 and over, adult: Secondary | ICD-10-CM | POA: Diagnosis not present

## 2019-01-30 DIAGNOSIS — J029 Acute pharyngitis, unspecified: Secondary | ICD-10-CM | POA: Diagnosis not present

## 2019-01-30 DIAGNOSIS — J309 Allergic rhinitis, unspecified: Secondary | ICD-10-CM | POA: Diagnosis not present

## 2019-02-01 ENCOUNTER — Other Ambulatory Visit: Payer: Self-pay

## 2019-02-01 ENCOUNTER — Telehealth: Payer: Self-pay | Admitting: Internal Medicine

## 2019-02-01 ENCOUNTER — Ambulatory Visit: Payer: BLUE CROSS/BLUE SHIELD | Admitting: Family Medicine

## 2019-02-01 ENCOUNTER — Encounter: Payer: Self-pay | Admitting: Family Medicine

## 2019-02-01 VITALS — BP 134/90 | HR 92 | Temp 98.6°F | Wt >= 6400 oz

## 2019-02-01 DIAGNOSIS — J029 Acute pharyngitis, unspecified: Secondary | ICD-10-CM

## 2019-02-01 DIAGNOSIS — J039 Acute tonsillitis, unspecified: Secondary | ICD-10-CM

## 2019-02-01 LAB — POCT RAPID STREP A (OFFICE): Rapid Strep A Screen: NEGATIVE

## 2019-02-01 MED ORDER — CEPHALEXIN 500 MG PO CAPS: 500.0000 mg | ORAL_CAPSULE | Freq: Three times a day (TID) | ORAL | 0 refills | Status: DC

## 2019-02-01 NOTE — Telephone Encounter (Signed)
LM for call back for CV Prescreen Speak to Christopher Krause - pt given direct call back

## 2019-02-01 NOTE — Progress Notes (Signed)
   Subjective:    Patient ID: Christopher Krause, male    DOB: [birth year], 43 y.o.   MRN: [id number]  HPI Here to follow up an urgent care visit on 01-30-19 for stuffy head, PND, ST, fever, and a dry cough. The fever went away after 24 hours. At the UC they felt he had a viral illness and told him to take Xyzal and drink fluids. However since then the ST has gotten and is focused on the right side. He also has some swelling in the right neck area.    Review of Systems  Constitutional: Positive for fever.  HENT: Positive for congestion, postnasal drip, sore throat and trouble swallowing. Negative for sinus pressure and sinus pain.   Eyes: Negative.   Respiratory: Positive for cough.        Objective:   Physical Exam Constitutional:      Appearance: Normal appearance. He is not ill-appearing.  HENT:     Right Ear: Tympanic membrane and ear canal normal.     Left Ear: Tympanic membrane and ear canal normal.     Nose: Nose normal.     Mouth/Throat:     Comments: The right tonsil is red, swollen, and covered with exudate  Eyes:     Conjunctiva/sclera: Conjunctivae normal.  Neck:     Comments: There are swollen and tender AC nodes on the right side.  Pulmonary:     Effort: Pulmonary effort is normal.     Breath sounds: Normal breath sounds.  Neurological:     Mental Status: He is alert.           Assessment & Plan:  Tonsillitis, treat with Keflex. Written out of work from 01-30-19 until 02-03-19. Gershon Crane, MD

## 2019-02-01 NOTE — Progress Notes (Signed)
poc

## 2019-02-01 NOTE — Telephone Encounter (Signed)
Questions for Screening COVID-19  Symptom onset:   NO cough.  Weakness and fatigue x 3-4 days ago - still a little fatigued, not back to 100% Sore throat Sinus pressure Head congestion Headaches - seen by UC Sat 3/14, given Rx for sore throat and sx's Feverish on Thursday and Friday but never checked temp. Body felt like it was running hot  No fever since Friday 01/29/2019 No shortness of breath  **PT AWARE TO CHECK IN AS NORMAL, PLEASE WEAR MASK THAT IS GIVEN TO HIM AND SIT IN DESIGNATED AREA.   Travel or Contacts:  No travel No contact with any one sick or who has traveled.  During this illness, did/does the patient experience any of the following symptoms? Fever >100.80F []   Yes []   No [x]   Unknown Subjective fever (felt feverish) [x]   Yes []   No []   Unknown -- 3/12-3/13 Chills []   Yes [x]   No []   Unknown Muscle aches (myalgia) []   Yes [x]   No []   Unknown Runny nose (rhinorrhea) [x]   Yes []   No []   Unknown Sore throat []   Yes []   No []   Unknown Cough (new onset or worsening of chronic cough) []   Yes [x]   No []   Unknown Shortness of breath (dyspnea) []   Yes [x]   No []   Unknown Nausea or vomiting []   Yes [x]   No []   Unknown Headache [x]   Yes [x]   No []   Unknown Abdominal pain  []   Yes [x]   No []   Unknown Diarrhea (?3 loose/looser than normal stools/24hr period) []   Yes [x]   No []   Unknown Other, specify:_____________________________________________   Patient risk factors: Smoker? []   Current []   Former [x]   Never If male, currently pregnant? []   Yes [x]   No  Patient Active Problem List   Diagnosis Date Noted  . HTN (hypertension) 12/31/2018  . Vitamin D deficiency 12/31/2018  . Acute meniscal tear of right knee 09/30/2013  . Reflux esophagitis 08/04/2012  . MORBID OBESITY 12/31/2007  . ALLERGIC RHINITIS 08/25/2007    Plan:  []   High risk for COVID-19 with red flags go to ED (with CP, SOB, weak/lightheaded, or fever > 101.5). Call ahead.  []   High risk for COVID-19  but stable will have car visit. Inform provider and coordinate time. Will be completed in afternoon. [x]   No red flags but URI signs or symptoms will go through side door and be seen in dedicated room.  Note: Referral to telemedicine is an appropriate alternative disposition for higher risk but stable. [facility]/e-Visit: [phone].

## 2019-02-02 ENCOUNTER — Ambulatory Visit: Payer: BLUE CROSS/BLUE SHIELD | Admitting: Internal Medicine

## 2019-04-04 ENCOUNTER — Other Ambulatory Visit: Payer: Self-pay | Admitting: Internal Medicine

## 2019-04-04 DIAGNOSIS — E559 Vitamin D deficiency, unspecified: Secondary | ICD-10-CM

## 2019-04-05 ENCOUNTER — Other Ambulatory Visit: Payer: Self-pay

## 2019-04-07 ENCOUNTER — Encounter: Payer: Self-pay | Admitting: Orthopaedic Surgery

## 2019-04-07 ENCOUNTER — Ambulatory Visit: Payer: BLUE CROSS/BLUE SHIELD | Admitting: Orthopaedic Surgery

## 2019-04-07 ENCOUNTER — Other Ambulatory Visit: Payer: Self-pay

## 2019-04-07 DIAGNOSIS — G8929 Other chronic pain: Secondary | ICD-10-CM | POA: Diagnosis not present

## 2019-04-07 DIAGNOSIS — M25562 Pain in left knee: Secondary | ICD-10-CM

## 2019-04-07 MED ORDER — LIDOCAINE HCL 1 % IJ SOLN
3.0000 mL | INTRAMUSCULAR | Status: AC | PRN
  Administered 2019-04-07: 3 mL

## 2019-04-07 MED ORDER — METHYLPREDNISOLONE ACETATE 40 MG/ML IJ SUSP
40.0000 mg | INTRAMUSCULAR | Status: AC | PRN
  Administered 2019-04-07: 16:00:00 40 mg via INTRA_ARTICULAR

## 2019-04-07 MED ORDER — TIZANIDINE HCL 4 MG PO TABS: 4.0000 mg | ORAL_TABLET | Freq: Two times a day (BID) | ORAL | 0 refills | Status: DC | PRN

## 2019-04-07 MED ORDER — METHYLPREDNISOLONE 4 MG PO TABS: ORAL_TABLET | ORAL | 0 refills | Status: DC
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
## 2019-04-16 ENCOUNTER — Telehealth: Payer: Self-pay | Admitting: *Deleted

## 2019-04-16 NOTE — Telephone Encounter (Signed)
Patient called but no answer. Left voicemail message for pt to return call. Pt will need to be offered testing for COVID-19 due to potential exposure during recent visit.

## 2019-05-31 ENCOUNTER — Telehealth: Payer: Self-pay | Admitting: Orthopaedic Surgery

## 2019-05-31 NOTE — Telephone Encounter (Signed)
Patient called wanting another cycle of Prednisone.  Please call patient [phone]

## 2019-05-31 NOTE — Telephone Encounter (Signed)
Please advise 

## 2019-06-01 MED ORDER — METHYLPREDNISOLONE 4 MG PO TABS: ORAL_TABLET | ORAL | 0 refills | Status: DC

## 2019-06-01 NOTE — Telephone Encounter (Signed)
Ill send it in

## 2019-09-15 ENCOUNTER — Ambulatory Visit: Payer: BLUE CROSS/BLUE SHIELD | Admitting: Orthopaedic Surgery

## 2019-10-04 ENCOUNTER — Telehealth: Payer: Self-pay | Admitting: Orthopaedic Surgery

## 2019-10-04 NOTE — Telephone Encounter (Signed)
Returned call to patient left message to call back to schedule an appointment with DR. Ninfa Linden or Artis Delay for knee and low back pain   Per patient request

## 2019-10-06 ENCOUNTER — Encounter: Payer: Self-pay | Admitting: Orthopaedic Surgery

## 2019-10-06 ENCOUNTER — Other Ambulatory Visit: Payer: Self-pay

## 2019-10-06 ENCOUNTER — Ambulatory Visit: Payer: BC Managed Care – PPO | Admitting: Orthopaedic Surgery

## 2019-10-06 DIAGNOSIS — M25562 Pain in left knee: Secondary | ICD-10-CM | POA: Diagnosis not present

## 2019-10-06 DIAGNOSIS — G8929 Other chronic pain: Secondary | ICD-10-CM | POA: Diagnosis not present

## 2019-10-06 DIAGNOSIS — M5442 Lumbago with sciatica, left side: Secondary | ICD-10-CM

## 2019-10-06 MED ORDER — METHYLPREDNISOLONE ACETATE 40 MG/ML IJ SUSP
40.0000 mg | INTRAMUSCULAR | Status: AC | PRN
  Administered 2019-10-06: 16:00:00 40 mg via INTRA_ARTICULAR

## 2019-10-06 MED ORDER — METHYLPREDNISOLONE 4 MG PO TABS: ORAL_TABLET | ORAL | 0 refills | Status: DC

## 2019-10-06 MED ORDER — TIZANIDINE HCL 4 MG PO TABS: 4.0000 mg | ORAL_TABLET | Freq: Two times a day (BID) | ORAL | 1 refills | Status: DC | PRN

## 2019-10-06 MED ORDER — LIDOCAINE HCL 1 % IJ SOLN
3.0000 mL | INTRAMUSCULAR | Status: AC | PRN
  Administered 2019-10-06: 16:00:00 3 mL
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
## 2019-10-08 ENCOUNTER — Telehealth: Payer: Self-pay | Admitting: Orthopaedic Surgery

## 2019-10-08 NOTE — Telephone Encounter (Signed)
Patient aware note at the front desk

## 2019-10-08 NOTE — Telephone Encounter (Signed)
Received voicemail message from patient advised he is still feeling pain and discomfort and asked if the work note can be extended to have him return to work Saturday 10/09/2019. The number to contact patient is [phone]

## 2019-10-29 ENCOUNTER — Other Ambulatory Visit: Payer: Self-pay | Admitting: Orthopaedic Surgery

## 2019-10-29 NOTE — Telephone Encounter (Signed)
Ok to fill 

## 2019-12-03 ENCOUNTER — Other Ambulatory Visit: Payer: Self-pay | Admitting: Orthopaedic Surgery

## 2019-12-17 ENCOUNTER — Other Ambulatory Visit: Payer: Self-pay | Admitting: Orthopaedic Surgery

## 2019-12-20 NOTE — Telephone Encounter (Signed)
Ok to refill 

## 2020-01-17 ENCOUNTER — Ambulatory Visit (INDEPENDENT_AMBULATORY_CARE_PROVIDER_SITE_OTHER): Payer: Self-pay | Admitting: Physician Assistant

## 2020-01-17 ENCOUNTER — Other Ambulatory Visit: Payer: Self-pay

## 2020-01-17 ENCOUNTER — Encounter: Payer: Self-pay | Admitting: Physician Assistant

## 2020-01-17 ENCOUNTER — Ambulatory Visit (INDEPENDENT_AMBULATORY_CARE_PROVIDER_SITE_OTHER): Payer: Self-pay

## 2020-01-17 DIAGNOSIS — M25561 Pain in right knee: Secondary | ICD-10-CM

## 2020-01-17 DIAGNOSIS — M1712 Unilateral primary osteoarthritis, left knee: Secondary | ICD-10-CM

## 2020-01-17 DIAGNOSIS — M722 Plantar fascial fibromatosis: Secondary | ICD-10-CM

## 2020-01-17 DIAGNOSIS — M1711 Unilateral primary osteoarthritis, right knee: Secondary | ICD-10-CM

## 2020-01-17 DIAGNOSIS — G8929 Other chronic pain: Secondary | ICD-10-CM

## 2020-01-17 MED ORDER — LIDOCAINE HCL 1 % IJ SOLN
0.5000 mL | INTRAMUSCULAR | Status: AC | PRN
  Administered 2020-01-17: .5 mL

## 2020-01-17 MED ORDER — METHYLPREDNISOLONE ACETATE 40 MG/ML IJ SUSP
40.0000 mg | INTRAMUSCULAR | Status: AC | PRN
  Administered 2020-01-17: 40 mg via INTRA_ARTICULAR
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
## 2020-02-01 ENCOUNTER — Telehealth: Payer: Self-pay | Admitting: Dietician

## 2020-02-01 NOTE — Telephone Encounter (Signed)
Brief Nutrition Note: Patient left a message at my [facility] office stating that he needed a nutrition appointment.  Called patient and left a message requesting that he call [phone] [facility] for an appointment.   Based on chart review, it appears that patient will need a referral for this appointment and it needs to be determined if this is for the Bariatric program or general nutrition/weight loss.  Oran Rein, RD, LDN, CDE

## 2020-12-13 DIAGNOSIS — H3561 Retinal hemorrhage, right eye: Secondary | ICD-10-CM | POA: Diagnosis not present

## 2020-12-13 DIAGNOSIS — H5213 Myopia, bilateral: Secondary | ICD-10-CM | POA: Diagnosis not present

## 2020-12-25 ENCOUNTER — Ambulatory Visit (INDEPENDENT_AMBULATORY_CARE_PROVIDER_SITE_OTHER): Payer: BC Managed Care – PPO | Admitting: Orthopaedic Surgery

## 2020-12-25 ENCOUNTER — Encounter: Payer: Self-pay | Admitting: Orthopaedic Surgery

## 2020-12-25 ENCOUNTER — Ambulatory Visit: Payer: Self-pay

## 2020-12-25 DIAGNOSIS — M25561 Pain in right knee: Secondary | ICD-10-CM | POA: Diagnosis not present

## 2020-12-25 DIAGNOSIS — G8929 Other chronic pain: Secondary | ICD-10-CM | POA: Diagnosis not present

## 2020-12-25 DIAGNOSIS — M25562 Pain in left knee: Secondary | ICD-10-CM | POA: Diagnosis not present

## 2020-12-25 DIAGNOSIS — M76822 Posterior tibial tendinitis, left leg: Secondary | ICD-10-CM

## 2020-12-25 DIAGNOSIS — M25572 Pain in left ankle and joints of left foot: Secondary | ICD-10-CM | POA: Diagnosis not present

## 2020-12-25 MED ORDER — METHYLPREDNISOLONE ACETATE 40 MG/ML IJ SUSP
40.0000 mg | INTRAMUSCULAR | Status: AC | PRN
  Administered 2020-12-25: 40 mg via INTRA_ARTICULAR

## 2020-12-25 MED ORDER — LIDOCAINE HCL 1 % IJ SOLN
3.0000 mL | INTRAMUSCULAR | Status: AC | PRN
  Administered 2020-12-25: 3 mL
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
## 2021-01-12 ENCOUNTER — Encounter: Payer: Self-pay | Admitting: Internal Medicine

## 2021-01-12 ENCOUNTER — Other Ambulatory Visit: Payer: Self-pay

## 2021-01-12 ENCOUNTER — Ambulatory Visit (INDEPENDENT_AMBULATORY_CARE_PROVIDER_SITE_OTHER): Payer: BC Managed Care – PPO | Admitting: Internal Medicine

## 2021-01-12 VITALS — BP 120/80 | HR 78 | Temp 98.1°F | Ht 71.0 in | Wt >= 6400 oz

## 2021-01-12 DIAGNOSIS — Z23 Encounter for immunization: Secondary | ICD-10-CM | POA: Diagnosis not present

## 2021-01-12 DIAGNOSIS — E559 Vitamin D deficiency, unspecified: Secondary | ICD-10-CM

## 2021-01-12 DIAGNOSIS — Z0000 Encounter for general adult medical examination without abnormal findings: Secondary | ICD-10-CM

## 2021-01-12 DIAGNOSIS — I10 Essential (primary) hypertension: Secondary | ICD-10-CM | POA: Diagnosis not present

## 2021-01-12 LAB — LIPID PANEL
Cholesterol: 168 mg/dL (ref 0–200)
HDL: 64.3 mg/dL (ref >39.00)
LDL Cholesterol: 94 mg/dL (ref 0–99)
NonHDL: 103.5
Total CHOL/HDL Ratio: 3
Triglycerides: 50 mg/dL (ref 0.0–149.0)
VLDL: 10 mg/dL (ref 0.0–40.0)

## 2021-01-12 LAB — CBC
HCT: 44.2 % (ref 39.0–52.0)
Hemoglobin: 14.8 g/dL (ref 13.0–17.0)
MCHC: 33.5 g/dL (ref 30.0–36.0)
MCV: 93.5 fl (ref 78.0–100.0)
Platelets: 235 10*3/uL (ref 150.0–400.0)
RBC: 4.73 Mil/uL (ref 4.22–5.81)
RDW: 13.5 % (ref 11.5–15.5)
WBC: 8 10*3/uL (ref 4.0–10.5)

## 2021-01-12 LAB — BASIC METABOLIC PANEL
BUN: 10 mg/dL (ref 6–23)
CO2: 29 mEq/L (ref 19–32)
Calcium: 9.5 mg/dL (ref 8.4–10.5)
Chloride: 104 mEq/L (ref 96–112)
Creatinine, Ser: 0.92 mg/dL (ref 0.40–1.50)
GFR: 101.25 mL/min (ref >60.00)
Glucose, Bld: 88 mg/dL (ref 70–99)
Potassium: 5.3 mEq/L — ABNORMAL HIGH (ref 3.5–5.1)
Sodium: 139 mEq/L (ref 135–145)

## 2021-01-12 LAB — HEMOGLOBIN A1C: Hgb A1c MFr Bld: 5.3 % (ref 4.6–6.5)

## 2021-01-12 LAB — VITAMIN B12: Vitamin B-12: 461 pg/mL (ref 211–911)

## 2021-01-12 LAB — VITAMIN D 25 HYDROXY (VIT D DEFICIENCY, FRACTURES): VITD: 25.17 ng/mL — ABNORMAL LOW (ref 30.00–100.00)

## 2021-01-12 LAB — TSH: TSH: 1.91 u[IU]/mL (ref 0.35–4.50)

## 2021-01-12 NOTE — Addendum Note (Signed)
Addended by: Kern Reap B on: 01/12/2021 03:52 PM   Modules accepted: Orders

## 2021-01-12 NOTE — Patient Instructions (Signed)
-Nice seeing you today!!  -Lab work today; will notify you once results are available.  -Flu vaccine today.  -Remember to get your COVID booster at your pharmacy.  -Schedule follow up in 6 months.   Preventive Care 45-45 Years Old, Male Preventive care refers to lifestyle choices and visits with your health care provider that can promote health and wellness. This includes:  A yearly physical exam. This is also called an annual wellness visit.  Regular dental and eye exams.  Immunizations.  Screening for certain conditions.  Healthy lifestyle choices, such as: ? Eating a healthy diet. ? Getting regular exercise. ? Not using drugs or products that contain nicotine and tobacco. ? Limiting alcohol use. What can I expect for my preventive care visit? Physical exam Your health care provider will check your:  Height and weight. These may be used to calculate your BMI (body mass index). BMI is a measurement that tells if you are at a healthy weight.  Heart rate and blood pressure.  Body temperature.  Skin for abnormal spots. Counseling Your health care provider may ask you questions about your:  Past medical problems.  Family's medical history.  Alcohol, tobacco, and drug use.  Emotional well-being.  Home life and relationship well-being.  Sexual activity.  Diet, exercise, and sleep habits.  Work and work Astronomer.  Access to firearms. What immunizations do I need? Vaccines are usually given at various ages, according to a schedule. Your health care provider will recommend vaccines for you based on your age, medical history, and lifestyle or other factors, such as travel or where you work.   What tests do I need? Blood tests  Lipid and cholesterol levels. These may be checked every 5 years, or more often if you are over 45 years old.  Hepatitis C test.  Hepatitis B test. Screening  Lung cancer screening. You may have this screening every year starting  at age 60 if you have a 30-pack-year history of smoking and currently smoke or have quit within the past 15 years.  Prostate cancer screening. Recommendations will vary depending on your family history and other risks.  Genital exam to check for testicular cancer or hernias.  Colorectal cancer screening. ? All adults should have this screening starting at age 22 and continuing until age 36. ? Your health care provider may recommend screening at age 22 if you are at increased risk. ? You will have tests every 1-10 years, depending on your results and the type of screening test.  Diabetes screening. ? This is done by checking your blood sugar (glucose) after you have not eaten for a while (fasting). ? You may have this done every 1-3 years.  STD (sexually transmitted disease) testing, if you are at risk. Follow these instructions at home: Eating and drinking  Eat a diet that includes fresh fruits and vegetables, whole grains, lean protein, and low-fat dairy products.  Take vitamin and mineral supplements as recommended by your health care provider.  Do not drink alcohol if your health care provider tells you not to drink.  If you drink alcohol: ? Limit how much you have to 0-2 drinks a day. ? Be aware of how much alcohol is in your drink. In the [place] one drink equals one 12 oz bottle of beer (355 mL), one 5 oz glass of wine (148 mL), or one 1 oz glass of hard liquor (44 mL).   Lifestyle  Take daily care of your teeth and gums. Brush your  teeth every morning and night with fluoride toothpaste. Floss one time each day.  Stay active. Exercise for at least 30 minutes 5 or more days each week.  Do not use any products that contain nicotine or tobacco, such as cigarettes, e-cigarettes, and chewing tobacco. If you need help quitting, ask your health care provider.  Do not use drugs.  If you are sexually active, practice safe sex. Use a condom or other form of protection to prevent STIs  (sexually transmitted infections).  If told by your health care provider, take low-dose aspirin daily starting at age 21.  Find healthy ways to cope with stress, such as: ? Meditation, yoga, or listening to music. ? Journaling. ? Talking to a trusted person. ? Spending time with friends and family. Safety  Always wear your seat belt while driving or riding in a vehicle.  Do not drive: ? If you have been drinking alcohol. Do not ride with someone who has been drinking. ? When you are tired or distracted. ? While texting.  Wear a helmet and other protective equipment during sports activities.  If you have firearms in your house, make sure you follow all gun safety procedures. What's next?  Go to your health care provider once a year for an annual wellness visit.  Ask your health care provider how often you should have your eyes and teeth checked.  Stay up to date on all vaccines. This information is not intended to replace advice given to you by your health care provider. Make sure you discuss any questions you have with your health care provider. Document Revised: 08/03/2019 Document Reviewed: 10/29/2018 Elsevier Patient Education  2021 [employer].
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
## 2021-01-12 NOTE — Addendum Note (Signed)
Addended by: Lerry Liner on: 01/12/2021 02:04 PM   Modules accepted: Orders

## 2021-01-16 ENCOUNTER — Other Ambulatory Visit: Payer: Self-pay | Admitting: Internal Medicine

## 2021-01-16 DIAGNOSIS — E559 Vitamin D deficiency, unspecified: Secondary | ICD-10-CM

## 2021-01-16 MED ORDER — VITAMIN D (ERGOCALCIFEROL) 1.25 MG (50000 UNIT) PO CAPS: 50000.0000 [IU] | ORAL_CAPSULE | ORAL | 0 refills | Status: AC

## 2021-01-17 ENCOUNTER — Other Ambulatory Visit: Payer: Self-pay | Admitting: Internal Medicine

## 2021-01-17 DIAGNOSIS — Z0000 Encounter for general adult medical examination without abnormal findings: Secondary | ICD-10-CM

## 2021-01-17 DIAGNOSIS — E875 Hyperkalemia: Secondary | ICD-10-CM

## 2021-01-17 DIAGNOSIS — E559 Vitamin D deficiency, unspecified: Secondary | ICD-10-CM

## 2021-05-07 DIAGNOSIS — J351 Hypertrophy of tonsils: Secondary | ICD-10-CM | POA: Diagnosis not present

## 2021-05-07 DIAGNOSIS — J029 Acute pharyngitis, unspecified: Secondary | ICD-10-CM | POA: Diagnosis not present

## 2021-05-07 DIAGNOSIS — R070 Pain in throat: Secondary | ICD-10-CM | POA: Diagnosis not present

## 2021-05-07 DIAGNOSIS — Z20822 Contact with and (suspected) exposure to covid-19: Secondary | ICD-10-CM | POA: Diagnosis not present

## 2021-05-08 ENCOUNTER — Telehealth: Payer: BC Managed Care – PPO | Admitting: Family Medicine

## 2021-05-08 ENCOUNTER — Encounter: Payer: Self-pay | Admitting: Family Medicine

## 2021-05-08 NOTE — Progress Notes (Signed)
Was scheduled for virtual visit today at 11:40. Please help pt to cancel, set up inperson care or reschedule if patient calls in. Could not reach pt at appt time, sent link several times, called several times and LM to connect or call and waited in virtual space until 10 minutes past appt time. Will move on to next patient. Dr. Selena Batten.

## 2021-12-26 DIAGNOSIS — H0288A Meibomian gland dysfunction right eye, upper and lower eyelids: Secondary | ICD-10-CM | POA: Diagnosis not present

## 2021-12-26 DIAGNOSIS — H1045 Other chronic allergic conjunctivitis: Secondary | ICD-10-CM | POA: Diagnosis not present

## 2021-12-26 DIAGNOSIS — H179 Unspecified corneal scar and opacity: Secondary | ICD-10-CM | POA: Diagnosis not present

## 2021-12-26 DIAGNOSIS — H0288B Meibomian gland dysfunction left eye, upper and lower eyelids: Secondary | ICD-10-CM | POA: Diagnosis not present

## 2021-12-31 ENCOUNTER — Ambulatory Visit: Payer: BC Managed Care – PPO | Admitting: Orthopaedic Surgery

## 2021-12-31 ENCOUNTER — Telehealth: Payer: Self-pay | Admitting: Orthopaedic Surgery

## 2021-12-31 ENCOUNTER — Ambulatory Visit: Payer: Self-pay

## 2021-12-31 ENCOUNTER — Other Ambulatory Visit: Payer: Self-pay

## 2021-12-31 DIAGNOSIS — G8929 Other chronic pain: Secondary | ICD-10-CM | POA: Diagnosis not present

## 2021-12-31 DIAGNOSIS — M25562 Pain in left knee: Secondary | ICD-10-CM

## 2021-12-31 DIAGNOSIS — M25511 Pain in right shoulder: Secondary | ICD-10-CM | POA: Diagnosis not present

## 2021-12-31 MED ORDER — LIDOCAINE HCL 1 % IJ SOLN
3.0000 mL | INTRAMUSCULAR | Status: AC | PRN
  Administered 2021-12-31: 3 mL

## 2021-12-31 MED ORDER — METHYLPREDNISOLONE ACETATE 40 MG/ML IJ SUSP
40.0000 mg | INTRAMUSCULAR | Status: AC | PRN
  Administered 2021-12-31: 40 mg via INTRA_ARTICULAR
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
## 2021-12-31 NOTE — Telephone Encounter (Signed)
Received medical records release form from patient  

## 2022-01-09 NOTE — Telephone Encounter (Signed)
Copy of records ready at front desk

## 2022-01-11 ENCOUNTER — Telehealth: Payer: Self-pay | Admitting: Internal Medicine

## 2022-01-11 NOTE — Telephone Encounter (Signed)
LVM to schedule appt for cpe.

## 2022-02-05 DIAGNOSIS — J019 Acute sinusitis, unspecified: Secondary | ICD-10-CM | POA: Diagnosis not present

## 2022-02-20 ENCOUNTER — Ambulatory Visit (INDEPENDENT_AMBULATORY_CARE_PROVIDER_SITE_OTHER): Payer: BC Managed Care – PPO | Admitting: Orthopaedic Surgery

## 2022-02-20 ENCOUNTER — Telehealth: Payer: Self-pay

## 2022-02-20 DIAGNOSIS — G8929 Other chronic pain: Secondary | ICD-10-CM | POA: Diagnosis not present

## 2022-02-20 DIAGNOSIS — M25562 Pain in left knee: Secondary | ICD-10-CM | POA: Diagnosis not present

## 2022-02-20 DIAGNOSIS — M1712 Unilateral primary osteoarthritis, left knee: Secondary | ICD-10-CM

## 2022-02-20 NOTE — Progress Notes (Signed)
Christopher Krause is well-known to [place].  He has been dealing with left knee pain for some time now.  Just over 6 weeks ago we did place a steroid injection of left knee and it did help somewhat but he still having clicking in his knee and pain.  He is someone who is morbidly obese and weighs over 400 pounds.  He has had a remote history of arthroscopic surgery on that knee with meniscal tearing. ? ?Examination of the left knee today does show patellofemoral grinding and crepitation throughout the arc of motion of the knee.  There is slight valgus malalignment.  Is difficult to tell if there is an effusion based on his body habitus and size. ? ?I have talked him about weight loss and quad strengthening exercises.  Given his patellofemoral chondromalacia and the pain he is experiencing from likely arthritis of that knee, we are recommending hyaluronic acid as the next step for his left knee given the failure of conservative treatment including steroid injections.  He agrees with this treatment plan.  Hopefully we can get this approved and we will see him back in follow-up to place hyaluronic acid into the left knee to treat the pain from osteoarthritis. ?

## 2022-02-20 NOTE — Telephone Encounter (Signed)
Left knee gel injection ?

## 2022-02-26 NOTE — Telephone Encounter (Signed)
Noted  

## 2022-03-19 DIAGNOSIS — J0100 Acute maxillary sinusitis, unspecified: Secondary | ICD-10-CM | POA: Diagnosis not present

## 2022-04-26 NOTE — Telephone Encounter (Signed)
Pt called for status for gel injection

## 2022-05-02 ENCOUNTER — Telehealth: Payer: Self-pay

## 2022-05-02 NOTE — Telephone Encounter (Signed)
VOB submitted for SynviscOne, left knee. BV pending. 

## 2022-05-02 NOTE — Telephone Encounter (Signed)
Noted. Will call patient with status

## 2022-05-03 ENCOUNTER — Telehealth: Payer: Self-pay

## 2022-05-03 NOTE — Telephone Encounter (Signed)
PA has been submitted through [employer] PA pending # [id number]

## 2022-05-07 ENCOUNTER — Other Ambulatory Visit: Payer: Self-pay

## 2022-05-07 DIAGNOSIS — M1712 Unilateral primary osteoarthritis, left knee: Secondary | ICD-10-CM

## 2022-05-16 ENCOUNTER — Ambulatory Visit: Payer: BC Managed Care – PPO | Admitting: Physician Assistant

## 2022-05-16 ENCOUNTER — Encounter: Payer: Self-pay | Admitting: Physician Assistant

## 2022-05-16 DIAGNOSIS — M1712 Unilateral primary osteoarthritis, left knee: Secondary | ICD-10-CM

## 2022-05-16 DIAGNOSIS — M1711 Unilateral primary osteoarthritis, right knee: Secondary | ICD-10-CM | POA: Diagnosis not present

## 2022-05-16 MED ORDER — METHYLPREDNISOLONE ACETATE 40 MG/ML IJ SUSP
40.0000 mg | INTRAMUSCULAR | Status: AC | PRN
  Administered 2022-05-16: 40 mg via INTRA_ARTICULAR

## 2022-05-16 MED ORDER — HYLAN G-F 20 48 MG/6ML IX SOSY
48.0000 mg | PREFILLED_SYRINGE | INTRA_ARTICULAR | Status: AC | PRN
  Administered 2022-05-16: 48 mg via INTRA_ARTICULAR

## 2022-05-16 MED ORDER — LIDOCAINE HCL 1 % IJ SOLN
3.0000 mL | INTRAMUSCULAR | Status: AC | PRN
  Administered 2022-05-16: 3 mL

## 2022-05-16 MED ORDER — TRAMADOL HCL 50 MG PO TABS
50.0000 mg | ORAL_TABLET | Freq: Four times a day (QID) | ORAL | 0 refills | Status: DC | PRN
Start: 2022-05-16 — End: 2022-10-02

## 2022-05-16 NOTE — Progress Notes (Signed)
   Procedure Note  Patient: Christopher Krause             Date of Birth: 1976/05/11           MRN: [id number]             Visit Date: 05/16/2022  HPI: Nuri comes in today for scheduled left knee Synvisc 1 injection.  He has known arthritis of the left knee and has failed conservative treatment which is included steroid injections and exercise.  Had no known injury to the left knee.  He does come in today with right knee pain that started Monday after getting off for a pop in the knee and limping.  His pain overall is improving.  Most the pain is posterior aspect of the knee.  He has been using Voltaren gel and ibuprofen which seem to be helping with the right knee pain.  He is requesting a cortisone injection the right knee.   Review of systems: Negative for fevers chills.  Bilateral knees: Good range of motion.  Patellofemoral crepitus both knees no abnormal warmth erythema.  Right knee tenderness posterior lateral joint line.  No gross instability valgus varus stressing of either knee.  Procedures: Visit Diagnoses:  1. Unilateral primary osteoarthritis, left knee   2. Primary osteoarthritis of right knee     Large Joint Inj: bilateral knee on 05/16/2022 4:35 PM Indications: pain Details: 22 G 1.5 in needle, anterolateral approach  Arthrogram: No  Medications (Right): 3 mL lidocaine 1 %; 40 mg methylPREDNISolone acetate 40 MG/ML Medications (Left): 48 mg Hylan 48 MG/6ML; 3 mL lidocaine 1 % Outcome: tolerated well, no immediate complications Procedure, treatment alternatives, risks and benefits explained, specific risks discussed. Consent was given by the patient. Immediately prior to procedure a time out was called to verify the correct patient, procedure, equipment, support staff and site/side marked as required. Patient was prepped and draped in the usual sterile fashion.     Plan: He will continue to work on weight loss.  Continue to work on strengthening both knees.  Knee  friendly exercises reviewed with him.  Questions encouraged and

## 2022-06-25 DIAGNOSIS — S39012A Strain of muscle, fascia and tendon of lower back, initial encounter: Secondary | ICD-10-CM | POA: Diagnosis not present

## 2022-09-30 ENCOUNTER — Encounter: Payer: Self-pay | Admitting: Physician Assistant

## 2022-09-30 ENCOUNTER — Ambulatory Visit: Payer: BC Managed Care – PPO | Admitting: Physician Assistant

## 2022-09-30 DIAGNOSIS — M76822 Posterior tibial tendinitis, left leg: Secondary | ICD-10-CM | POA: Diagnosis not present

## 2022-09-30 MED ORDER — METHYLPREDNISOLONE 4 MG PO TABS: ORAL_TABLET | ORAL | 0 refills | Status: DC
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
## 2022-09-30 NOTE — Addendum Note (Signed)
Addended by: Barbette Or on: 09/30/2022 04:26 PM   Modules accepted: Orders

## 2022-10-02 ENCOUNTER — Other Ambulatory Visit: Payer: Self-pay | Admitting: Physician Assistant

## 2022-10-02 ENCOUNTER — Telehealth: Payer: Self-pay | Admitting: Physician Assistant

## 2022-10-02 MED ORDER — TRAMADOL HCL 50 MG PO TABS: 50.0000 mg | ORAL_TABLET | Freq: Four times a day (QID) | ORAL | 0 refills | Status: DC | PRN

## 2022-10-02 NOTE — Telephone Encounter (Signed)
Patient requesting a work note to extend his absence until saturday and also requesting pain medication. Please call [phone]

## 2022-10-02 NOTE — Telephone Encounter (Signed)
My chart message sent to pt.

## 2022-10-02 NOTE — Telephone Encounter (Signed)
Note done

## 2022-10-03 ENCOUNTER — Ambulatory Visit: Payer: BC Managed Care – PPO | Attending: Physician Assistant

## 2022-10-03 ENCOUNTER — Other Ambulatory Visit: Payer: Self-pay

## 2022-10-03 DIAGNOSIS — M76822 Posterior tibial tendinitis, left leg: Secondary | ICD-10-CM | POA: Diagnosis not present

## 2022-10-03 DIAGNOSIS — M6281 Muscle weakness (generalized): Secondary | ICD-10-CM | POA: Diagnosis not present

## 2022-10-03 DIAGNOSIS — R262 Difficulty in walking, not elsewhere classified: Secondary | ICD-10-CM | POA: Diagnosis not present

## 2022-10-03 DIAGNOSIS — M79662 Pain in left lower leg: Secondary | ICD-10-CM | POA: Diagnosis not present

## 2022-10-03 DIAGNOSIS — M25572 Pain in left ankle and joints of left foot: Secondary | ICD-10-CM | POA: Diagnosis not present
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
## 2022-10-15 ENCOUNTER — Ambulatory Visit: Payer: BC Managed Care – PPO

## 2022-10-15 DIAGNOSIS — M6281 Muscle weakness (generalized): Secondary | ICD-10-CM | POA: Diagnosis not present

## 2022-10-15 DIAGNOSIS — R262 Difficulty in walking, not elsewhere classified: Secondary | ICD-10-CM | POA: Diagnosis not present

## 2022-10-15 DIAGNOSIS — M76822 Posterior tibial tendinitis, left leg: Secondary | ICD-10-CM | POA: Diagnosis not present

## 2022-10-15 DIAGNOSIS — M25572 Pain in left ankle and joints of left foot: Secondary | ICD-10-CM

## 2022-10-15 DIAGNOSIS — M79662 Pain in left lower leg: Secondary | ICD-10-CM
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
## 2022-10-22 ENCOUNTER — Ambulatory Visit: Payer: BC Managed Care – PPO | Attending: Physician Assistant

## 2022-10-22 DIAGNOSIS — M6281 Muscle weakness (generalized): Secondary | ICD-10-CM | POA: Diagnosis not present

## 2022-10-22 DIAGNOSIS — R262 Difficulty in walking, not elsewhere classified: Secondary | ICD-10-CM | POA: Insufficient documentation

## 2022-10-22 DIAGNOSIS — M79662 Pain in left lower leg: Secondary | ICD-10-CM | POA: Insufficient documentation

## 2022-10-22 DIAGNOSIS — M25572 Pain in left ankle and joints of left foot: Secondary | ICD-10-CM | POA: Diagnosis not present
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
## 2022-10-29 ENCOUNTER — Ambulatory Visit: Payer: BC Managed Care – PPO | Admitting: Physical Therapy

## 2022-10-31 ENCOUNTER — Ambulatory Visit: Payer: BC Managed Care – PPO

## 2022-12-06 ENCOUNTER — Ambulatory Visit
Admission: EM | Admit: 2022-12-06 | Discharge: 2022-12-06 | Disposition: A | Discharge status: Home / Self Care | Payer: BC Managed Care – PPO | Attending: Physician Assistant | Admitting: Physician Assistant

## 2022-12-06 DIAGNOSIS — Z1152 Encounter for screening for COVID-19: Secondary | ICD-10-CM | POA: Diagnosis not present

## 2022-12-06 DIAGNOSIS — J069 Acute upper respiratory infection, unspecified: Secondary | ICD-10-CM
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
## 2022-12-06 NOTE — ED Triage Notes (Signed)
Patient presents to UC for cough, itchy throat, fatigue, and nasal congestion since Tuesday. Treating symptoms with mucinex and tylenol. Last known fever weds.

## 2022-12-07 LAB — SARS CORONAVIRUS 2 (TAT 6-24 HRS): SARS Coronavirus 2: NEGATIVE

## 2022-12-31 DIAGNOSIS — H52223 Regular astigmatism, bilateral: Secondary | ICD-10-CM | POA: Diagnosis not present

## 2022-12-31 DIAGNOSIS — H524 Presbyopia: Secondary | ICD-10-CM | POA: Diagnosis not present

## 2022-12-31 DIAGNOSIS — H5213 Myopia, bilateral: Secondary | ICD-10-CM | POA: Diagnosis not present

## 2023-01-23 ENCOUNTER — Encounter: Payer: Self-pay | Admitting: Radiology

## 2023-04-21 ENCOUNTER — Ambulatory Visit: Payer: BC Managed Care – PPO | Admitting: Orthopaedic Surgery

## 2023-04-21 ENCOUNTER — Ambulatory Visit: Payer: BC Managed Care – PPO | Admitting: Physician Assistant

## 2023-04-21 DIAGNOSIS — M722 Plantar fascial fibromatosis: Secondary | ICD-10-CM

## 2023-04-21 DIAGNOSIS — G8929 Other chronic pain: Secondary | ICD-10-CM | POA: Diagnosis not present

## 2023-04-21 DIAGNOSIS — M25562 Pain in left knee: Secondary | ICD-10-CM | POA: Diagnosis not present

## 2023-04-21 MED ORDER — LIDOCAINE HCL 1 % IJ SOLN
3.0000 mL | INTRAMUSCULAR | Status: AC | PRN
Start: 2023-04-21 — End: 2023-04-21
  Administered 2023-04-21: 3 mL

## 2023-04-21 MED ORDER — METHYLPREDNISOLONE ACETATE 40 MG/ML IJ SUSP
40.0000 mg | INTRAMUSCULAR | Status: AC | PRN
Start: 2023-04-21 — End: 2023-04-21
  Administered 2023-04-21: 40 mg via INTRA_ARTICULAR

## 2023-04-21 NOTE — Progress Notes (Signed)
The patient is well-known to [place].  He is on a weight loss journey and is working on weight loss.  He says he is down 20 pounds over the last month as he tries is continue to lose weight.  He has been walking try to lose weight and is felt some pain in his foot and ankle on the right side.  He is ordered plantar fasciitis insert which has helped quite a bit.  He is requesting a steroid injection in his left knee today.  Is been 4 months since he has had a steroid injection in that left knee.  On exam his Achilles is intact and his plantar fascia seems to be intact on the right side but it is painful.  His range of motion of his ankle is full and there is no loss of his arch.  He is wearing firm shoes.  Per his request I did place a steroid injection in his left knee today.  We gave him a Thera-Band to work on Achilles stretching and strengthening for the right side and he demonstrated how to use this.  Hopefully as he continues on his weight loss journey things will do well for him.  Follow-up is as needed.     Procedure Note  Patient: Christopher Krause             Date of Birth: 10-11-1976           MRN: [id number]             Visit Date: 04/21/2023  Procedures: Visit Diagnoses:  1. Chronic pain of left knee   2. Plantar fasciitis of right foot     Large Joint Inj: L knee on 04/21/2023 4:37 PM Indications: diagnostic evaluation and pain Details: 22 G 1.5 in needle, superolateral approach  Arthrogram: No  Medications: 3 mL lidocaine 1 %; 40 mg methylPREDNISolone acetate 40 MG/ML Outcome: tolerated well, no immediate complications Procedure, treatment alternatives, risks and benefits explained, specific risks discussed. Consent was given by the patient. Immediately prior to procedure a time out was called to verify the correct patient, procedure, equipment, support staff and site/side marked as required. Patient was prepped and draped in the usual sterile fashion.

## 2023-06-11 ENCOUNTER — Encounter: Payer: Self-pay | Admitting: Internal Medicine

## 2023-06-11 ENCOUNTER — Ambulatory Visit (INDEPENDENT_AMBULATORY_CARE_PROVIDER_SITE_OTHER): Payer: BC Managed Care – PPO | Admitting: Internal Medicine

## 2023-06-11 VITALS — BP 150/103 | HR 70 | Temp 98.1°F | Ht 70.0 in | Wt >= 6400 oz

## 2023-06-11 DIAGNOSIS — Z125 Encounter for screening for malignant neoplasm of prostate: Secondary | ICD-10-CM

## 2023-06-11 DIAGNOSIS — I10 Essential (primary) hypertension: Secondary | ICD-10-CM | POA: Diagnosis not present

## 2023-06-11 DIAGNOSIS — Z0000 Encounter for general adult medical examination without abnormal findings: Secondary | ICD-10-CM

## 2023-06-11 DIAGNOSIS — K2100 Gastro-esophageal reflux disease with esophagitis, without bleeding: Secondary | ICD-10-CM

## 2023-06-11 DIAGNOSIS — Z1211 Encounter for screening for malignant neoplasm of colon: Secondary | ICD-10-CM

## 2023-06-11 DIAGNOSIS — E559 Vitamin D deficiency, unspecified: Secondary | ICD-10-CM | POA: Diagnosis not present

## 2023-06-11 LAB — COMPREHENSIVE METABOLIC PANEL
ALT: 17 U/L (ref 0–53)
AST: 20 U/L (ref 0–37)
Albumin: 3.9 g/dL (ref 3.5–5.2)
Alkaline Phosphatase: 67 U/L (ref 39–117)
BUN: 10 mg/dL (ref 6–23)
CO2: 25 mEq/L (ref 19–32)
Calcium: 9 mg/dL (ref 8.4–10.5)
Chloride: 106 mEq/L (ref 96–112)
Creatinine, Ser: 0.8 mg/dL (ref 0.40–1.50)
GFR: 105.91 mL/min (ref >60.00)
Glucose, Bld: 79 mg/dL (ref 70–99)
Potassium: 4.5 mEq/L (ref 3.5–5.1)
Sodium: 140 mEq/L (ref 135–145)
Total Bilirubin: 0.5 mg/dL (ref 0.2–1.2)
Total Protein: 6.6 g/dL (ref 6.0–8.3)

## 2023-06-11 LAB — CBC WITH DIFFERENTIAL/PLATELET
Basophils Absolute: 0.1 10*3/uL (ref 0.0–0.1)
Basophils Relative: 1.1 % (ref 0.0–3.0)
Eosinophils Absolute: 0.3 10*3/uL (ref 0.0–0.7)
Eosinophils Relative: 4.9 % (ref 0.0–5.0)
HCT: 46.7 % (ref 39.0–52.0)
Hemoglobin: 14.7 g/dL (ref 13.0–17.0)
Lymphocytes Relative: 26.3 % (ref 12.0–46.0)
Lymphs Abs: 1.7 10*3/uL (ref 0.7–4.0)
MCHC: 31.4 g/dL (ref 30.0–36.0)
MCV: 97.3 fl (ref 78.0–100.0)
Monocytes Absolute: 0.5 10*3/uL (ref 0.1–1.0)
Monocytes Relative: 8.5 % (ref 3.0–12.0)
Neutro Abs: 3.8 10*3/uL (ref 1.4–7.7)
Neutrophils Relative %: 59.2 % (ref 43.0–77.0)
Platelets: 251 10*3/uL (ref 150.0–400.0)
RBC: 4.8 Mil/uL (ref 4.22–5.81)
RDW: 13.3 % (ref 11.5–15.5)
WBC: 6.4 10*3/uL (ref 4.0–10.5)

## 2023-06-11 LAB — VITAMIN B12: Vitamin B-12: 536 pg/mL (ref 211–911)

## 2023-06-11 LAB — HEMOGLOBIN A1C: Hgb A1c MFr Bld: 5.2 % (ref 4.6–6.5)

## 2023-06-11 LAB — LIPID PANEL
Cholesterol: 166 mg/dL (ref 0–200)
HDL: 71.1 mg/dL (ref >39.00)
LDL Cholesterol: 82 mg/dL (ref 0–99)
NonHDL: 94.59
Total CHOL/HDL Ratio: 2
Triglycerides: 63 mg/dL (ref 0.0–149.0)
VLDL: 12.6 mg/dL (ref 0.0–40.0)

## 2023-06-11 LAB — VITAMIN D 25 HYDROXY (VIT D DEFICIENCY, FRACTURES): VITD: 23.52 ng/mL — ABNORMAL LOW (ref 30.00–100.00)

## 2023-06-11 LAB — TSH: TSH: 1.61 u[IU]/mL (ref 0.35–5.50)

## 2023-06-11 LAB — PSA: PSA: 0.43 ng/mL (ref 0.10–4.00)
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
## 2023-06-16 ENCOUNTER — Other Ambulatory Visit: Payer: Self-pay | Admitting: Internal Medicine

## 2023-06-16 DIAGNOSIS — E559 Vitamin D deficiency, unspecified: Secondary | ICD-10-CM

## 2023-06-16 MED ORDER — VITAMIN D (ERGOCALCIFEROL) 1.25 MG (50000 UNIT) PO CAPS
50000.0000 [IU] | ORAL_CAPSULE | ORAL | 0 refills | Status: AC
Start: 2023-06-16 — End: 2023-09-02

## 2023-09-09 ENCOUNTER — Other Ambulatory Visit: Payer: Self-pay | Admitting: Internal Medicine

## 2023-09-09 DIAGNOSIS — E559 Vitamin D deficiency, unspecified: Secondary | ICD-10-CM

## 2023-09-11 ENCOUNTER — Ambulatory Visit: Payer: BC Managed Care – PPO | Admitting: Internal Medicine

## 2023-09-25 ENCOUNTER — Ambulatory Visit: Payer: BC Managed Care – PPO | Admitting: Internal Medicine

## 2023-11-21 ENCOUNTER — Encounter: Payer: Self-pay | Admitting: Pediatrics

## 2023-12-18 ENCOUNTER — Telehealth: Payer: Self-pay | Admitting: *Deleted

## 2023-12-18 NOTE — Telephone Encounter (Signed)
Dr Doy Hutching, patient with BMI of 69.  Direct or OV?  Please reply to both me and your CMA.  Thank you!

## 2023-12-19 NOTE — Telephone Encounter (Signed)
Previsit and [facility] colonoscopy cancelled. Office visit scheduled for 02/18/24 with Dr Doy Hutching.  I have left a voicemail for patient to call back.

## 2023-12-22 NOTE — Telephone Encounter (Signed)
I have spoken to patient to advise of need for office visit prior to any procedures. Patient is agreeable to this. He is advised that previous colonoscopy and previsit have both been cancelled and he is instead scheduled for office visit on 02/18/24 with Dr Doy Hutching. He verbalizes understanding of this information.

## 2024-01-05 DIAGNOSIS — H0288B Meibomian gland dysfunction left eye, upper and lower eyelids: Secondary | ICD-10-CM | POA: Diagnosis not present

## 2024-01-05 DIAGNOSIS — H0288A Meibomian gland dysfunction right eye, upper and lower eyelids: Secondary | ICD-10-CM | POA: Diagnosis not present

## 2024-01-05 DIAGNOSIS — H1045 Other chronic allergic conjunctivitis: Secondary | ICD-10-CM | POA: Diagnosis not present

## 2024-01-05 DIAGNOSIS — H179 Unspecified corneal scar and opacity: Secondary | ICD-10-CM | POA: Diagnosis not present

## 2024-01-08 ENCOUNTER — Other Ambulatory Visit: Payer: Self-pay

## 2024-01-08 ENCOUNTER — Encounter: Payer: Self-pay | Admitting: Radiology

## 2024-01-08 ENCOUNTER — Encounter: Payer: Self-pay | Admitting: Physician Assistant

## 2024-01-08 ENCOUNTER — Ambulatory Visit: Payer: BC Managed Care – PPO | Admitting: Physician Assistant

## 2024-01-08 VITALS — BP 137/86

## 2024-01-08 DIAGNOSIS — M25562 Pain in left knee: Secondary | ICD-10-CM | POA: Diagnosis not present

## 2024-01-08 DIAGNOSIS — G8929 Other chronic pain: Secondary | ICD-10-CM

## 2024-01-08 MED ORDER — METHYLPREDNISOLONE ACETATE 40 MG/ML IJ SUSP
40.0000 mg | INTRAMUSCULAR | Status: AC | PRN
  Administered 2024-01-08: 40 mg via INTRA_ARTICULAR

## 2024-01-08 MED ORDER — LIDOCAINE HCL 1 % IJ SOLN
3.0000 mL | INTRAMUSCULAR | Status: AC | PRN
  Administered 2024-01-08: 3 mL

## 2024-01-08 NOTE — Progress Notes (Signed)
   Procedure Note  Patient: Christopher Krause             Date of Birth: 02/04/1976           MRN: [id number]             Visit Date: 01/08/2024 HPI: [name] comes in today requesting left knee cortisone injection.  This last seen by Dr. Raye Sorrow 04/21/2023 and given a cortisone injection and he states it was very helpful.  States his knee pain began about a month and 1/2 to 2 months ago.  Mostly pains at the inferior pole of the patella.  Dull achy.  No known injury.  He has had no fevers or chills.  Physical exam: Blood pressure 137/86 General: Well-developed well-nourished male no acute distress mood and affect appropriate. Left knee: Significant crepitus with passive range of motion.  No gross instability.  Tenderness along medial joint line.  Able to do straight leg raise.  Overall good range of motion in the knee.  No abnormal warmth erythema.  Radiographs: AP and lateral view left knee: No acute fractures.  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  Lateral compartment with osteophytes off the lateral margin of the femoral condyle.  No acute fractures.  Knee is well located.  Procedures: Visit Diagnoses:  1. Chronic pain of left knee     Large Joint Inj: L knee on 01/08/2024 4:12 PM Indications: pain Details: 22 G 1.5 in needle, anterolateral approach  Arthrogram: No  Medications: 3 mL lidocaine 1 %; 40 mg methylPREDNISolone acetate 40 MG/ML Outcome: tolerated well, no immediate complications Procedure, treatment alternatives, risks and benefits explained, specific risks discussed. Consent was given by the patient. Immediately prior to procedure a time out was called to verify the correct patient, procedure, equipment, support staff and site/side marked as required. Patient was prepped and draped in the usual sterile fashion.     Plan: He knows to wait at least 3 months between injections.  He follow-up with [place] as needed.   Questions were encouraged at length

## 2024-01-12 ENCOUNTER — Encounter: Payer: BC Managed Care – PPO | Admitting: Pediatrics

## 2024-01-28 DIAGNOSIS — S39012A Strain of muscle, fascia and tendon of lower back, initial encounter: Secondary | ICD-10-CM | POA: Diagnosis not present
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
## 2024-02-18 ENCOUNTER — Encounter: Payer: Self-pay | Admitting: Pediatrics

## 2024-02-18 ENCOUNTER — Ambulatory Visit: Payer: BC Managed Care – PPO | Admitting: Pediatrics

## 2024-02-18 VITALS — BP 130/80 | HR 87 | Ht 69.0 in | Wt >= 6400 oz

## 2024-02-18 DIAGNOSIS — Z1211 Encounter for screening for malignant neoplasm of colon: Secondary | ICD-10-CM | POA: Insufficient documentation

## 2024-02-18 DIAGNOSIS — Z83719 Family history of colon polyps, unspecified: Secondary | ICD-10-CM

## 2024-02-18 DIAGNOSIS — Z800 Family history of malignant neoplasm of digestive organs: Secondary | ICD-10-CM

## 2024-02-18 DIAGNOSIS — K219 Gastro-esophageal reflux disease without esophagitis: Secondary | ICD-10-CM | POA: Diagnosis not present

## 2024-02-18 MED ORDER — NA SULFATE-K SULFATE-MG SULF 17.5-3.13-1.6 GM/177ML PO SOLN: 1.0000 | Freq: Once | ORAL | 0 refills | Status: AC

## 2024-02-18 NOTE — Patient Instructions (Addendum)
 You have been scheduled for a colonoscopy. Please follow written instructions given to you at your visit today.   If you use inhalers (even only as needed), please bring them with you on the day of your procedure.  DO NOT TAKE 7 DAYS PRIOR TO TEST- Trulicity (dulaglutide) Ozempic, Wegovy (semaglutide) Mounjaro (tirzepatide) Bydureon Bcise (exanatide extended release)  DO NOT TAKE 1 DAY PRIOR TO YOUR TEST Rybelsus (semaglutide) Adlyxin (lixisenatide) Victoza (liraglutide) Byetta (exanatide) _________________________________________________________________________  Thank you for entrusting me with your care and for choosing [facility], Dr. Maren Beach  _______________________________________________________  If your blood pressure at your visit was 140/90 or greater, please contact your primary care physician to follow up on this.  _______________________________________________________  If you are age 21 or older, your body mass index should be between 23-30. Your Body mass index is 74.58 kg/m. If this is out of the aforementioned range listed, please consider follow up with your Primary Care Provider.  If you are age 10 or younger, your body mass index should be between 19-25. Your Body mass index is 74.58 kg/m. If this is out of the aformentioned range listed, please consider follow up with your Primary Care Provider.   ________________________________________________________  The [facility] providers would like to encourage you to use [facility] to communicate with providers for non-urgent requests or questions.  Due to long hold times on the telephone, sending your provider a message by [employer] may be a faster and more efficient way to get a response.  Please allow 48 business hours for a response.  Please remember that this is for non-urgent requests.  _______________________________________________________

## 2024-04-21 ENCOUNTER — Telehealth: Payer: Self-pay | Admitting: Gastroenterology

## 2024-04-21 NOTE — Telephone Encounter (Addendum)
 Procedure:Colonoscopy Procedure date: 04/29/24 Procedure location: [facility] Arrival Time: 7:45 am Spoke with the patient Y/N:   No, I left a detailed message 04/21/24 @ 11:13 am for the patient to return call  No, I left a detailed message 04/22/24 @ 10:17 am for the patient to return call  04/23/24 mychart message sent No response, TE routed  Any prep concerns? ___  Has the patient obtained the prep from the pharmacy ? ___ Do you have a care partner and transportation: ___ Any additional concerns? ___

## 2024-04-22 ENCOUNTER — Encounter (HOSPITAL_COMMUNITY): Payer: Self-pay | Admitting: Pediatrics

## 2024-04-22 NOTE — Progress Notes (Signed)
 Attempted to obtain medical history for pre op call via telephone, unable to reach at this time. HIPAA compliant voicemail message left requesting return call to pre surgical testing department.

## 2024-04-27 NOTE — Telephone Encounter (Signed)
 Ok, noted. Thank you.

## 2024-04-27 NOTE — Telephone Encounter (Signed)
 Called and spoke with patient to confirm colonoscopy procedure appt at [facility] with Dr. Yvone Herd on Thursday, 04/29/24 at 9:15 am. Pt confirmed that he has his prep and instructions. Pt is aware that he will need to arrive at [facility] by 7:45 am with a care partner. Pt verbalized understanding and had no concerns at the end of the call.

## 2024-04-29 ENCOUNTER — Ambulatory Visit (HOSPITAL_COMMUNITY)
Admission: RE | Admit: 2024-04-29 | Discharge: 2024-04-29 | Disposition: A | Discharge status: Home / Self Care | Attending: Pediatrics | Admitting: Pediatrics

## 2024-04-29 ENCOUNTER — Encounter (HOSPITAL_COMMUNITY)
Admission: RE | Disposition: A | Discharge status: Home / Self Care | Payer: Self-pay | Source: Home / Self Care | Attending: Pediatrics

## 2024-04-29 ENCOUNTER — Encounter (HOSPITAL_COMMUNITY): Payer: Self-pay | Admitting: Pediatrics

## 2024-04-29 ENCOUNTER — Ambulatory Visit (HOSPITAL_COMMUNITY): Admitting: Anesthesiology

## 2024-04-29 ENCOUNTER — Other Ambulatory Visit: Payer: Self-pay

## 2024-04-29 DIAGNOSIS — Z83719 Family history of colon polyps, unspecified: Secondary | ICD-10-CM | POA: Diagnosis not present

## 2024-04-29 DIAGNOSIS — K5730 Diverticulosis of large intestine without perforation or abscess without bleeding: Secondary | ICD-10-CM | POA: Diagnosis not present

## 2024-04-29 DIAGNOSIS — Z79899 Other long term (current) drug therapy: Secondary | ICD-10-CM | POA: Diagnosis not present

## 2024-04-29 DIAGNOSIS — G4730 Sleep apnea, unspecified: Secondary | ICD-10-CM | POA: Insufficient documentation

## 2024-04-29 DIAGNOSIS — K317 Polyp of stomach and duodenum: Secondary | ICD-10-CM | POA: Insufficient documentation

## 2024-04-29 DIAGNOSIS — Z800 Family history of malignant neoplasm of digestive organs: Secondary | ICD-10-CM | POA: Diagnosis not present

## 2024-04-29 DIAGNOSIS — Z791 Long term (current) use of non-steroidal anti-inflammatories (NSAID): Secondary | ICD-10-CM | POA: Diagnosis not present

## 2024-04-29 DIAGNOSIS — E6689 Other obesity not elsewhere classified: Secondary | ICD-10-CM | POA: Diagnosis not present

## 2024-04-29 DIAGNOSIS — K648 Other hemorrhoids: Secondary | ICD-10-CM | POA: Insufficient documentation

## 2024-04-29 DIAGNOSIS — I10 Essential (primary) hypertension: Secondary | ICD-10-CM | POA: Diagnosis not present

## 2024-04-29 DIAGNOSIS — Z6841 Body Mass Index (BMI) 40.0 and over, adult: Secondary | ICD-10-CM | POA: Insufficient documentation

## 2024-04-29 DIAGNOSIS — D128 Benign neoplasm of rectum: Secondary | ICD-10-CM | POA: Insufficient documentation

## 2024-04-29 DIAGNOSIS — K2100 Gastro-esophageal reflux disease with esophagitis, without bleeding: Secondary | ICD-10-CM | POA: Insufficient documentation

## 2024-04-29 DIAGNOSIS — M1990 Unspecified osteoarthritis, unspecified site: Secondary | ICD-10-CM | POA: Diagnosis not present

## 2024-04-29 DIAGNOSIS — Z833 Family history of diabetes mellitus: Secondary | ICD-10-CM | POA: Diagnosis not present

## 2024-04-29 DIAGNOSIS — Z1211 Encounter for screening for malignant neoplasm of colon: Secondary | ICD-10-CM | POA: Insufficient documentation

## 2024-04-29 DIAGNOSIS — K635 Polyp of colon: Secondary | ICD-10-CM

## 2024-04-29 HISTORY — PX: POLYPECTOMY: SHX149

## 2024-04-29 HISTORY — PX: ESOPHAGOGASTRODUODENOSCOPY: SHX5428

## 2024-04-29 HISTORY — PX: COLONOSCOPY: SHX5424

## 2024-04-29 SURGERY — COLONOSCOPY
Anesthesia: Monitor Anesthesia Care

## 2024-04-29 MED ORDER — SODIUM CHLORIDE 0.9% FLUSH: 3.0000 mL | INTRAVENOUS | Status: DC | PRN

## 2024-04-29 MED ORDER — PROPOFOL 1000 MG/100ML IV EMUL
INTRAVENOUS | Status: AC
  Filled 2024-04-29: qty 100

## 2024-04-29 MED ORDER — LIDOCAINE 2% (20 MG/ML) 5 ML SYRINGE
INTRAMUSCULAR | Status: DC | PRN
  Administered 2024-04-29: 100 mg via INTRAVENOUS

## 2024-04-29 MED ORDER — FENTANYL CITRATE (PF) 100 MCG/2ML IJ SOLN
INTRAMUSCULAR | Status: AC
Start: 2024-04-29 — End: 2024-04-29
  Filled 2024-04-29: qty 2

## 2024-04-29 MED ORDER — PHENYLEPHRINE HCL (PRESSORS) 10 MG/ML IV SOLN
INTRAVENOUS | Status: AC
  Filled 2024-04-29: qty 1

## 2024-04-29 MED ORDER — SODIUM CHLORIDE 0.9% FLUSH: 3.0000 mL | Freq: Two times a day (BID) | INTRAVENOUS | Status: DC

## 2024-04-29 MED ORDER — FENTANYL CITRATE (PF) 100 MCG/2ML IJ SOLN
INTRAMUSCULAR | Status: DC | PRN
  Administered 2024-04-29: 50 ug via INTRAVENOUS

## 2024-04-29 MED ORDER — SODIUM CHLORIDE 0.9 % IV SOLN
INTRAVENOUS | Status: AC | PRN
  Administered 2024-04-29: 500 mL via INTRAMUSCULAR

## 2024-04-29 MED ORDER — PROPOFOL 10 MG/ML IV BOLUS
INTRAVENOUS | Status: DC | PRN
Start: 2024-04-29 — End: 2024-04-29
  Administered 2024-04-29: 50 mg via INTRAVENOUS
  Administered 2024-04-29: 505 mg via INTRAVENOUS
  Administered 2024-04-29: 115 ug/kg/min via INTRAVENOUS

## 2024-04-29 MED ORDER — ETOMIDATE 2 MG/ML IV SOLN
INTRAVENOUS | Status: AC
  Filled 2024-04-29: qty 10

## 2024-04-29 MED ORDER — SODIUM CHLORIDE 0.9 % IV SOLN: INTRAVENOUS | Status: DC | PRN

## 2024-04-29 NOTE — Anesthesia Procedure Notes (Signed)
 Procedure Name: MAC Date/Time: 04/29/2024 9:30 AM  Performed by: Darlena Ego, CRNAPatient Re-evaluated:Patient Re-evaluated prior to induction Oxygen Delivery Method: Simple face mask Preoxygenation: Pre-oxygenation with 100% oxygen Induction Type: IV induction Airway Equipment and Method: Oral airway

## 2024-04-29 NOTE — Transfer of Care (Signed)
 Immediate Anesthesia Transfer of Care Note  Patient: Christopher Krause  Procedure(s) Performed: COLONOSCOPY EGD (ESOPHAGOGASTRODUODENOSCOPY) POLYPECTOMY, INTESTINE  Patient Location: PACU and Endoscopy Unit  Anesthesia Type:MAC  Level of Consciousness: awake, alert , and oriented  Airway & Oxygen Therapy: Patient Spontanous Breathing and Patient connected to nasal cannula oxygen  Post-op Assessment: Report given to RN and Post -op Vital signs reviewed and stable  Post vital signs: Reviewed and stable  Last Vitals:  Vitals Value Taken Time  BP 124/71 04/29/24 10:14  Temp    Pulse 94 04/29/24 10:14  Resp 19 04/29/24 10:14  SpO2 97 % 04/29/24 10:14    Last Pain:  Vitals:   04/29/24 0827  TempSrc: Temporal  PainSc: 0-No pain         Complications: No notable events documented.

## 2024-04-29 NOTE — Op Note (Signed)
 [facility] [address] Patient Name: Christopher Krause Procedure Date: 04/29/2024 MRN: [id number] Attending MD: Eugenia Hess , MD, [id number] Date of Birth: 04/15/1976 CSN: [id number] Age: 48 Admit Type: Outpatient Procedure:                Upper GI endoscopy Indications:              Follow-up of gastro-esophageal reflux disease,                            Screening for Barrett's esophagus Providers:                Eugenia Hess, MD, Suzann Ernst, RN, Alfredia Ina, Technician Referring MD:              Medicines:                Monitored Anesthesia Care Complications:            No immediate complications. Estimated blood loss:                            None. Estimated Blood Loss:     Estimated blood loss: none. Procedure:                Pre-Anesthesia Assessment:                           - Prior to the procedure, a History and Physical                            was performed, and patient medications and                            allergies were reviewed. The patient's tolerance of                            previous anesthesia was also reviewed. The risks                            and benefits of the procedure and the sedation                            options and risks were discussed with the patient.                            All questions were answered, and informed consent                            was obtained. Prior Anticoagulants: The patient has                            taken no anticoagulant or antiplatelet agents. ASA                            Grade Assessment: III -  A patient with severe                            systemic disease. After reviewing the risks and                            benefits, the patient was deemed in satisfactory                            condition to undergo the procedure.                           After obtaining informed consent, the endoscope was                            passed under direct  vision. Throughout the                            procedure, the patient's blood pressure, pulse, and                            oxygen saturations were monitored continuously. The                            [id number]-[id number] ([id number]) Olympus endoscope was introduced                            through the mouth, and advanced to the second part                            of duodenum. The upper GI endoscopy was                            accomplished without difficulty. The patient                            tolerated the procedure well. Scope In: 9:33:32 AM Scope Out: 9:37:01 AM Total Procedure Duration: 0 hours 3 minutes 29 seconds  Findings:      The examined esophagus was normal. No evidence of Barrett's esophagus.      The gastric body, gastric antrum, cardia (on retroflexion) and gastric       fundus (on retroflexion) were normal.      A few small sessile polyps were found in the gastric fundus and in the       gastric body.      The duodenal bulb and second portion of the duodenum were normal. Impression:               - Normal esophagus. No evidence of Barrett's                            esophagus.                           - Normal gastric body, antrum, cardia and gastric  fundus.                           - A few gastric polyps. These had the appearance of                            benign fundic gland polyps.                           - Normal duodenal bulb and second portion of the                            duodenum.                           - No specimens collected. Moderate Sedation:      Not Applicable - Patient had care per Anesthesia. Recommendation:           - Continue present medications for reflux.                           - Perform a colonoscopy today.                           - The findings and recommendations were discussed                            with the patient. Procedure Code(s):        --- Professional ---                            [id number], Esophagogastroduodenoscopy, flexible,                            transoral; diagnostic, including collection of                            specimen(s) by brushing or washing, when performed                            (separate procedure) Diagnosis Code(s):        --- Professional ---                           K31.7, Polyp of stomach and duodenum                           K21.9, Gastro-esophageal reflux disease without                            esophagitis                           Z13.810, Encounter for screening for upper                            gastrointestinal disorder CPT copyright 2022 American Medical Association. All rights reserved. The codes documented  in this report are preliminary and upon coder review may  be revised to meet current compliance requirements. Eugenia Hess, MD 04/29/2024 10:05:14 AM This report has been signed electronically. Number of Addenda: 0

## 2024-04-29 NOTE — Discharge Instructions (Signed)

## 2024-04-29 NOTE — Anesthesia Preprocedure Evaluation (Signed)
 Anesthesia Evaluation  Patient identified by MRN, date of birth, ID band Patient awake    Reviewed: Allergy & Precautions, NPO status , Patient's Chart, lab work & pertinent test results, reviewed documented beta blocker date and time   History of Anesthesia Complications Negative for: history of anesthetic complications  Airway Mallampati: II  TM Distance: >3 FB     Dental  (+) Partial Upper   Pulmonary neg shortness of breath, sleep apnea , neg COPD   breath sounds clear to auscultation       Cardiovascular hypertension, (-) CAD, (-) Past MI and (-) Cardiac Stents  Rhythm:Regular Rate:Normal     Neuro/Psych neg Seizures    GI/Hepatic ,GERD  ,,(+) neg Cirrhosis        Endo/Other    Class 4 obesity  Renal/GU Renal disease     Musculoskeletal  (+) Arthritis ,    Abdominal   Peds  Hematology   Anesthesia Other Findings   Reproductive/Obstetrics                              Anesthesia Physical Anesthesia Plan  ASA: 3  Anesthesia Plan: MAC   Post-op Pain Management:    Induction: Intravenous  PONV Risk Score and Plan: 1 and Ondansetron  and Propofol  infusion  Airway Management Planned: Natural Airway and Simple Face Mask  Additional Equipment:   Intra-op Plan:   Post-operative Plan:   Informed Consent: I have reviewed the patients History and Physical, chart, labs and discussed the procedure including the risks, benefits and alternatives for the proposed anesthesia with the patient or authorized representative who has indicated his/her understanding and acceptance.     Dental advisory given  Plan Discussed with: CRNA  Anesthesia Plan Comments:          Anesthesia Quick Evaluation
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
## 2024-04-29 NOTE — Anesthesia Postprocedure Evaluation (Signed)
 Anesthesia Post Note  Patient: SIRAJ DERMODY  Procedure(s) Performed: COLONOSCOPY EGD (ESOPHAGOGASTRODUODENOSCOPY) POLYPECTOMY, INTESTINE     Patient location during evaluation: PACU Anesthesia Type: MAC Level of consciousness: awake and alert Pain management: pain level controlled Vital Signs Assessment: post-procedure vital signs reviewed and stable Respiratory status: spontaneous breathing, nonlabored ventilation, respiratory function stable and patient connected to nasal cannula oxygen Cardiovascular status: stable and blood pressure returned to baseline Postop Assessment: no apparent nausea or vomiting Anesthetic complications: no   No notable events documented.  Last Vitals:  Vitals:   04/29/24 1025 04/29/24 1030  BP:  (!) 141/85  Pulse: 75 73  Resp: 14 15  Temp:    SpO2: 100% 98%    Last Pain:  Vitals:   04/29/24 1025  TempSrc:   PainSc: 0-No pain                 Leslye Rast

## 2024-04-29 NOTE — Op Note (Signed)
 [facility] Patient Name: Christopher Krause Procedure Date: 04/29/2024 MRN: [id number] Attending MD: Eugenia Hess , MD, [id number] Date of Birth: Aug 28, 1976 CSN: [id number] Age: 48 Admit Type: Outpatient Procedure:                Colonoscopy Indications:              Colon cancer screening in patient at increased                            risk: Family history of 1st-degree relative with                            colon polyps, Family history of colorectal cancer                            in a paternal aunt in her 99s, this is the                            patient's first colonoscopy Providers:                Eugenia Hess, MD, Suzann Ernst, RN, Alfredia Ina, Technician Referring MD:              Medicines:                Monitored Anesthesia Care Complications:            No immediate complications. Estimated blood loss:                            Minimal. Estimated Blood Loss:     Estimated blood loss was minimal. Procedure:                Pre-Anesthesia Assessment:                           - Prior to the procedure, a History and Physical                            was performed, and patient medications and                            allergies were reviewed. The patient's tolerance of                            previous anesthesia was also reviewed. The risks                            and benefits of the procedure and the sedation                            options and risks were discussed with the patient.                            All  questions were answered, and informed consent                            was obtained. Prior Anticoagulants: The patient has                            taken no anticoagulant or antiplatelet agents. ASA                            Grade Assessment: III - A patient with severe                            systemic disease. After reviewing the risks and                            benefits, the patient  was deemed in satisfactory                            condition to undergo the procedure.                           After obtaining informed consent, the colonoscope                            was passed under direct vision. Throughout the                            procedure, the patient's blood pressure, pulse, and                            oxygen saturations were monitored continuously. The                            CF-HQ190L ([id number]) Olympus colonoscope was                            introduced through the anus and advanced to the                            terminal ileum. The colonoscopy was performed                            without difficulty. The patient tolerated the                            procedure well. The quality of the bowel                            preparation was good. The terminal ileum, ileocecal                            valve, appendiceal orifice, and rectum were  photographed. Scope In: 9:42:24 AM Scope Out: 10:03:06 AM Scope Withdrawal Time: 0 hours 14 minutes 43 seconds  Total Procedure Duration: 0 hours 20 minutes 42 seconds  Findings:      The perianal and digital rectal examinations were normal. Pertinent       negatives include normal sphincter tone and no palpable rectal lesions.      A few small-mouthed diverticula were found in the transverse colon and       ascending colon.      A 6 mm polyp was found in the rectum. The polyp was sessile. The polyp       was removed with a cold snare. Resection and retrieval were complete.      Internal hemorrhoids were found during retroflexion. Impression:               - Diverticulosis in the transverse colon and in the                            ascending colon.                           - One 6 mm polyp in the rectum, removed with a cold                            snare. Resected and retrieved.                           - Internal hemorrhoids. Moderate Sedation:      Not  Applicable - Patient had care per Anesthesia. Recommendation:           - Discharge patient to home (ambulatory).                           - Await pathology results.                           - Repeat colonoscopy for surveillance based on                            pathology results.                           - The findings and recommendations were discussed                            with the patient.                           - Patient has a contact number available for                            emergencies. The signs and symptoms of potential                            delayed complications were discussed with the                            patient.  Return to normal activities tomorrow.                            Written discharge instructions were provided to the                            patient. Procedure Code(s):        --- Professional ---                           [phone], Colonoscopy, flexible; with removal of                            tumor(s), polyp(s), or other lesion(s) by snare                            technique Diagnosis Code(s):        --- Professional ---                           Z83.71, Family history of colonic polyps                           K64.8, Other hemorrhoids                           D12.8, Benign neoplasm of rectum                           K57.30, Diverticulosis of large intestine without                            perforation or abscess without bleeding CPT copyright 2022 [employer]. All rights reserved. The codes documented in this report are preliminary and upon coder review may  be revised to meet current compliance requirements. Eugenia Hess, MD 04/29/2024 10:09:11 AM This report has been signed electronically. Number of Addenda: 0

## 2024-04-30 LAB — SURGICAL PATHOLOGY

## 2024-05-01 ENCOUNTER — Ambulatory Visit: Payer: Self-pay | Admitting: Pediatrics

## 2024-05-02 ENCOUNTER — Encounter (HOSPITAL_COMMUNITY): Payer: Self-pay | Admitting: Pediatrics

## 2024-06-02 DIAGNOSIS — G8929 Other chronic pain: Secondary | ICD-10-CM | POA: Diagnosis not present

## 2024-06-02 DIAGNOSIS — M5459 Other low back pain: Secondary | ICD-10-CM | POA: Diagnosis not present

## 2024-06-08 DIAGNOSIS — M545 Low back pain, unspecified: Secondary | ICD-10-CM | POA: Diagnosis not present

## 2024-06-09 DIAGNOSIS — M545 Low back pain, unspecified: Secondary | ICD-10-CM | POA: Diagnosis not present

## 2024-06-10 DIAGNOSIS — M9902 Segmental and somatic dysfunction of thoracic region: Secondary | ICD-10-CM | POA: Diagnosis not present

## 2024-06-10 DIAGNOSIS — M5417 Radiculopathy, lumbosacral region: Secondary | ICD-10-CM | POA: Diagnosis not present

## 2024-06-10 DIAGNOSIS — M9905 Segmental and somatic dysfunction of pelvic region: Secondary | ICD-10-CM | POA: Diagnosis not present

## 2024-06-10 DIAGNOSIS — M9903 Segmental and somatic dysfunction of lumbar region: Secondary | ICD-10-CM | POA: Diagnosis not present

## 2024-06-10 DIAGNOSIS — M9904 Segmental and somatic dysfunction of sacral region: Secondary | ICD-10-CM | POA: Diagnosis not present

## 2024-06-11 DIAGNOSIS — M9905 Segmental and somatic dysfunction of pelvic region: Secondary | ICD-10-CM | POA: Diagnosis not present

## 2024-06-11 DIAGNOSIS — M9904 Segmental and somatic dysfunction of sacral region: Secondary | ICD-10-CM | POA: Diagnosis not present

## 2024-06-11 DIAGNOSIS — M5417 Radiculopathy, lumbosacral region: Secondary | ICD-10-CM | POA: Diagnosis not present

## 2024-06-11 DIAGNOSIS — M9903 Segmental and somatic dysfunction of lumbar region: Secondary | ICD-10-CM | POA: Diagnosis not present

## 2024-06-14 ENCOUNTER — Encounter: Payer: BC Managed Care – PPO | Admitting: Internal Medicine

## 2024-06-16 DIAGNOSIS — M9903 Segmental and somatic dysfunction of lumbar region: Secondary | ICD-10-CM | POA: Diagnosis not present

## 2024-06-16 DIAGNOSIS — M5417 Radiculopathy, lumbosacral region: Secondary | ICD-10-CM | POA: Diagnosis not present

## 2024-06-16 DIAGNOSIS — M9905 Segmental and somatic dysfunction of pelvic region: Secondary | ICD-10-CM | POA: Diagnosis not present

## 2024-06-16 DIAGNOSIS — M9904 Segmental and somatic dysfunction of sacral region: Secondary | ICD-10-CM | POA: Diagnosis not present

## 2024-06-24 ENCOUNTER — Ambulatory Visit: Admitting: Physician Assistant

## 2024-06-24 ENCOUNTER — Other Ambulatory Visit (INDEPENDENT_AMBULATORY_CARE_PROVIDER_SITE_OTHER): Payer: Self-pay

## 2024-06-24 DIAGNOSIS — M545 Low back pain, unspecified: Secondary | ICD-10-CM

## 2024-06-24 MED ORDER — METHOCARBAMOL 500 MG PO TABS
500.0000 mg | ORAL_TABLET | Freq: Three times a day (TID) | ORAL | 1 refills | Status: AC
Start: 2024-06-24 — End: ?

## 2024-06-24 NOTE — Progress Notes (Signed)
  HPI: Christopher Krause comes in today for low back pain has been ongoing for 4 weeks.  He has been seen in urgent care and also seen a chiropractor.  Urgent care diagnosed him with a muscle strain and gave him a Dosepak and a muscle relaxant.  He has been to the chiropractor for adjustments.  He states that he is having mid back pain no radicular symptoms.  Denies any waking, bowel bladder dysfunction, saddle anesthesia or fevers chills.  He notes no weakness of the lower extremities.  He states the pain has gotten somewhat better but still having sharp pain middle low back he rates his pain to be 8 out of 10 at worst and 5-6 out of 10 most of the time.  He is currently taking ibuprofen, Mobic and cyclobenzaprine .  Review of systems: See HPI otherwise negative  Physical exam: General: No acute distress.  Ambulates without any assistive device. Psych: Alert and oriented x 3 Respirations: Unlabored Lower extremities: 5 out of 5 strength throughout the lower extremities against resistance.  Good range of motion bilateral hips without pain. Lumbar spine: Positive straight leg raise on the right negative on the left.  Radiographs: Lumbar spine 2 views: Films are somewhat suboptimal due to poor penetration given patient's size.  However overall lumbar vertebral bodies are well aligned and no spondylolisthesis.  L4-5 facet arthritic changes.  No gross abnormalities or fractures.  Impression: Low back pain  Plan: Discussed with him that he should take Mobic for ibuprofen but not both.  He feels that the ibuprofen is most beneficial and therefore will take the ibuprofen.  He would like a prescription for Robaxin  instead of the Flexeril  as he does not feel that the Flexeril  works as well as the Robaxin .  Will send him to formal physical therapy for core strengthening, back exercises, home exercise program, stretching and modalities.  See him back in 6 weeks sooner if there is any questions concerns or if condition  worsens in any way.

## 2024-09-20 ENCOUNTER — Encounter: Payer: Self-pay | Admitting: Radiology

## 2024-09-20 DIAGNOSIS — R0982 Postnasal drip: Secondary | ICD-10-CM | POA: Diagnosis not present

## 2024-11-02 ENCOUNTER — Other Ambulatory Visit: Payer: Self-pay | Admitting: Physician Assistant

## 2024-12-09 ENCOUNTER — Ambulatory Visit: Admitting: Physician Assistant

## 2024-12-09 DIAGNOSIS — M25562 Pain in left knee: Secondary | ICD-10-CM | POA: Diagnosis not present

## 2024-12-09 DIAGNOSIS — G8929 Other chronic pain: Secondary | ICD-10-CM

## 2024-12-09 MED ORDER — LIDOCAINE HCL 1 % IJ SOLN
3.0000 mL | INTRAMUSCULAR | Status: AC | PRN
  Administered 2024-12-09: 3 mL

## 2024-12-09 MED ORDER — METHYLPREDNISOLONE ACETATE 40 MG/ML IJ SUSP
40.0000 mg | INTRAMUSCULAR | Status: AC | PRN
  Administered 2024-12-09: 40 mg via INTRA_ARTICULAR

## 2024-12-09 NOTE — Progress Notes (Addendum)
" ° °  Procedure Note  Patient: Christopher Krause             Date of Birth: March 06, 1976           MRN: [id number]             Visit Date: 12/09/2024 HPI: Hermina comes in today due to left knee pain.  Last injection was 01/16/2024.  He states this was very helpful until about 2 weeks ago.  He has had no injury.  States that his work load however is increased.  He also notes that he wearing a pair of [employer] brand sneakers also seem to bother to his knee.  Denies any fevers chills.  He reports that his most recent labs were all within normal limits he has no known diabetes.  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  Physical exam: General: Well-developed well-nourished male no acute distress ambulates without any assistive device able to get on and off the exam table on his own. Left knee: Good range of motion.  Significant patellofemoral crepitus.  No abnormal warmth erythema or effusion. Procedures: Visit Diagnoses:  1. Chronic pain of left knee     Large Joint Inj: L knee on 12/09/2024 4:24 PM Indications: pain Details: 22 G 1.5 in needle, superolateral approach  Arthrogram: No  Medications: 3 mL lidocaine  1 %; 40 mg methylPREDNISolone  acetate 40 MG/ML Outcome: tolerated well, no immediate complications Procedure, treatment alternatives, risks and benefits explained, specific risks discussed. Consent was given by the patient. Immediately prior to procedure a time out was called to verify the correct patient, procedure, equipment, support staff and site/side marked as required. Patient was prepped and draped in the usual sterile fashion.     Plan: He knows to wait at least 3 months between injections.  He tolerated the injection well today.  He did ask about being out of work until this coming Monday and note was given.   "
